# Patient Record
Sex: Male | Born: 1980 | Hispanic: Refuse to answer | Marital: Single | Smoking: Never smoker
Health system: Southern US, Community
[De-identification: ages and names within clinical notes are randomized; demographics above are authoritative.]

## PROBLEM LIST (undated history)

## (undated) DIAGNOSIS — J45909 Unspecified asthma, uncomplicated: Secondary | ICD-10-CM

## (undated) HISTORY — PX: TONSILLECTOMY: SUR1361

---

## 2019-04-03 ENCOUNTER — Emergency Department (HOSPITAL_COMMUNITY): Payer: Self-pay

## 2019-04-03 ENCOUNTER — Encounter (HOSPITAL_COMMUNITY): Payer: Self-pay | Admitting: Emergency Medicine

## 2019-04-03 ENCOUNTER — Emergency Department (HOSPITAL_COMMUNITY)
Admission: EM | Admit: 2019-04-03 | Discharge: 2019-04-03 | Disposition: A | Discharge status: Home / Self Care | Payer: Self-pay | Attending: Emergency Medicine | Admitting: Emergency Medicine

## 2019-04-03 ENCOUNTER — Other Ambulatory Visit: Payer: Self-pay

## 2019-04-03 DIAGNOSIS — Z20828 Contact with and (suspected) exposure to other viral communicable diseases: Secondary | ICD-10-CM | POA: Insufficient documentation

## 2019-04-03 DIAGNOSIS — J189 Pneumonia, unspecified organism: Secondary | ICD-10-CM | POA: Insufficient documentation

## 2019-04-03 DIAGNOSIS — K0381 Cracked tooth: Secondary | ICD-10-CM | POA: Insufficient documentation

## 2019-04-03 DIAGNOSIS — J45909 Unspecified asthma, uncomplicated: Secondary | ICD-10-CM | POA: Insufficient documentation

## 2019-04-03 DIAGNOSIS — K0889 Other specified disorders of teeth and supporting structures: Secondary | ICD-10-CM

## 2019-04-03 LAB — CBC WITH DIFFERENTIAL/PLATELET
Abs Immature Granulocytes: 0.02 10*3/uL (ref 0.00–0.07)
Basophils Absolute: 0 10*3/uL (ref 0.0–0.1)
Basophils Relative: 0 %
Eosinophils Absolute: 0.5 10*3/uL (ref 0.0–0.5)
Eosinophils Relative: 6 %
HCT: 43.6 % (ref 39.0–52.0)
Hemoglobin: 14.7 g/dL (ref 13.0–17.0)
Immature Granulocytes: 0 %
Lymphocytes Relative: 18 %
Lymphs Abs: 1.6 10*3/uL (ref 0.7–4.0)
MCH: 28.5 pg (ref 26.0–34.0)
MCHC: 33.7 g/dL (ref 30.0–36.0)
MCV: 84.5 fL (ref 80.0–100.0)
Monocytes Absolute: 0.6 10*3/uL (ref 0.1–1.0)
Monocytes Relative: 7 %
Neutro Abs: 6.2 10*3/uL (ref 1.7–7.7)
Neutrophils Relative %: 69 %
Platelets: 257 10*3/uL (ref 150–400)
RBC: 5.16 MIL/uL (ref 4.22–5.81)
RDW: 12.5 % (ref 11.5–15.5)
WBC: 9 10*3/uL (ref 4.0–10.5)
nRBC: 0 % (ref 0.0–0.2)

## 2019-04-03 LAB — BASIC METABOLIC PANEL
Anion gap: 10 (ref 5–15)
BUN: 6 mg/dL (ref 6–20)
CO2: 28 mmol/L (ref 22–32)
Calcium: 9.8 mg/dL (ref 8.9–10.3)
Chloride: 98 mmol/L (ref 98–111)
Creatinine, Ser: 1.33 mg/dL — ABNORMAL HIGH (ref 0.61–1.24)
GFR calc Af Amer: >60 mL/min (ref >60)
GFR calc non Af Amer: >60 mL/min (ref >60)
Glucose, Bld: 89 mg/dL (ref 70–99)
Potassium: 3.8 mmol/L (ref 3.5–5.1)
Sodium: 136 mmol/L (ref 135–145)

## 2019-04-03 LAB — SARS CORONAVIRUS 2 BY RT PCR (HOSPITAL ORDER, PERFORMED IN ~~LOC~~ HOSPITAL LAB): SARS Coronavirus 2: NEGATIVE

## 2019-04-03 MED ORDER — IOHEXOL 300 MG/ML  SOLN
75.0000 mL | Freq: Once | INTRAMUSCULAR | Status: AC | PRN
  Administered 2019-04-03: 17:00:00 75 mL via INTRAVENOUS

## 2019-04-03 MED ORDER — DOXYCYCLINE HYCLATE 100 MG PO CAPS: 100.0000 mg | ORAL_CAPSULE | Freq: Two times a day (BID) | ORAL | 0 refills | Status: AC

## 2019-04-03 MED ORDER — ALBUTEROL SULFATE HFA 108 (90 BASE) MCG/ACT IN AERS: 1.0000 | INHALATION_SPRAY | Freq: Four times a day (QID) | RESPIRATORY_TRACT | 0 refills | Status: DC | PRN

## 2019-04-03 MED ORDER — ALBUTEROL SULFATE HFA 108 (90 BASE) MCG/ACT IN AERS
4.0000 | INHALATION_SPRAY | Freq: Once | RESPIRATORY_TRACT | Status: AC
  Administered 2019-04-03: 15:00:00 4 via RESPIRATORY_TRACT
  Filled 2019-04-03: qty 6.7

## 2019-04-03 MED ORDER — PREDNISONE 20 MG PO TABS
60.0000 mg | ORAL_TABLET | Freq: Once | ORAL | Status: AC
  Administered 2019-04-03: 60 mg via ORAL
  Filled 2019-04-03 (×2): qty 3

## 2019-04-03 MED ORDER — AMOXICILLIN 500 MG PO CAPS: 500.0000 mg | ORAL_CAPSULE | Freq: Two times a day (BID) | ORAL | 0 refills | Status: DC

## 2019-04-03 NOTE — ED Notes (Signed)
Called CT.  They stated they will call us when the next pt is done in room 3.

## 2019-04-03 NOTE — ED Triage Notes (Signed)
Pt reports a 2 mont cracked tooth that is now infected. Pt wants some antibiotics.

## 2019-04-03 NOTE — ED Notes (Signed)
Patient stated that he would not take the prednisone before consulting with his "family heritage." Pt declined the medication at this time.

## 2019-04-03 NOTE — ED Provider Notes (Signed)
[facility] EMERGENCY DEPARTMENT Provider Note   CSN: [id number] Arrival date & time: 04/03/19  1255     History   Chief Complaint Chief Complaint  Patient presents with  . Dental Pain    HPI Haley Kuchera is a 38 y.o. male Modena Jansky for evaluation of dental pain.  He reports that over the last 2 months, he has had a cracked right upper tooth.  He states that it occasionally causes him pain.  He feels like over the last couple days, and is gotten worse.  He states he has had pain and irritation to his gum.  He has not noticed any facial swelling, fevers.  He states he still able to chew and is not any vomiting or difficulty breathing.  He states he does not have a dentist he follows up with.  He also states that he had a history of childhood asthma.  He states that over the last couple weeks, he has been wheezing and has had a slight cough.  He states he does not have any inhalers that he uses.     The history is provided by the patient.    History reviewed. No pertinent past medical history.  There are no active problems to display for this patient.   History reviewed. No pertinent surgical history.      Home Medications    Prior to Admission medications   Medication Sig Start Date End Date Taking? Authorizing Provider  albuterol (VENTOLIN HFA) 108 (90 Base) MCG/ACT inhaler Inhale 1-2 puffs into the lungs every 6 (six) hours as needed for wheezing or shortness of breath. 04/03/19   Graciella Freer A, PA-C  amoxicillin (AMOXIL) 500 MG capsule Take 1 capsule (500 mg total) by mouth 2 (two) times daily. 04/03/19   Maxwell Caul, PA-C  doxycycline (VIBRAMYCIN) 100 MG capsule Take 1 capsule (100 mg total) by mouth 2 (two) times daily for 7 days. 04/03/19 04/10/19  Maxwell Caul, PA-C    Family History No family history on file.  Social History Social History   Tobacco Use  . Smoking status: Never Smoker  . Smokeless tobacco: Never Used  Substance Use Topics   . Alcohol use: Not Currently  . Drug use: Never     Allergies   Patient has no known allergies.   Review of Systems Review of Systems  Constitutional: Negative for fever.  HENT: Positive for dental problem. Negative for facial swelling and trouble swallowing.   Respiratory: Positive for cough and wheezing. Negative for shortness of breath.   All other systems reviewed and are negative.    Physical Exam Updated Vital Signs BP 117/78 (BP Location: Left Arm)   Pulse 74   Temp 98.5 F (36.9 C) (Oral)   Resp 18   Ht 5\' 11"  (1.803 m)   Wt 72.6 kg   SpO2 99%   BMI 22.32 kg/m   Physical Exam Vitals signs and nursing note reviewed.  Constitutional:      Appearance: He is well-developed.  HENT:     Head: Normocephalic and atraumatic.     Comments: Face is symmetric in appearance without any overlying warmth, erythema, edema.    Mouth/Throat:      Comments: Airways patent, phonation is intact.  Uvula is midline.  No swelling noted to floor of mouth. Eyes:     General: No scleral icterus.       Right eye: No discharge.        Left eye: No  discharge.     Conjunctiva/sclera: Conjunctivae normal.  Cardiovascular:     Rate and Rhythm: Normal rate and regular rhythm.  Pulmonary:     Effort: Pulmonary effort is normal.     Breath sounds: Wheezing present.     Comments: Mild wheezing noted throughout all lung fields.  Able to speak in full sentences without any difficulty.  No evidence of respiratory distress. Skin:    General: Skin is warm and dry.  Neurological:     Mental Status: He is alert.  Psychiatric:        Speech: Speech normal.        Behavior: Behavior normal.      ED Treatments / Results  Labs (all labs ordered are listed, but only abnormal results are displayed) Labs Reviewed  BASIC METABOLIC PANEL - Abnormal; Notable for the following components:      Result Value   Creatinine, Ser 1.33 (*)    All other components within normal limits  SARS  CORONAVIRUS 2 (HOSPITAL ORDER, PERFORMED IN [facility])  CBC WITH DIFFERENTIAL/PLATELET    EKG None  Radiology Dg Chest 2 View  Result Date: 04/03/2019 CLINICAL DATA:  Productive cough EXAM: CHEST - 2 VIEW COMPARISON:  None. FINDINGS: The heart size and mediastinal contours are within normal limits. There is bilateral heterogeneous and nodular airspace opacity, most conspicuous in the left lower lobe although also seen in the right upper lobe. The visualized skeletal structures are unremarkable. IMPRESSION: There is bilateral heterogeneous and nodular airspace opacity, most conspicuous in the left lower lobe although also seen in the right upper lobe. Findings are concerning for multifocal infection, including atypical infection. Consider CT to further evaluate. Electronically Signed   By: Lauralyn PrimesAlex  Bibbey M.D.   On: 04/03/2019 14:03   Ct Chest W Contrast  Result Date: 04/03/2019 CLINICAL DATA:  Infected tooth. Productive cough. Airspace opacities on chest radiography. EXAM: CT CHEST WITH CONTRAST TECHNIQUE: Multidetector CT imaging of the chest was performed following the standard protocol during bolus administration of intravenous contrast. CONTRAST:  75mL OMNIPAQUE IOHEXOL 300 MG/ML  SOLN COMPARISON:  Chest radiograph 04/03/2019 FINDINGS: CT CHEST FINDINGS Cardiovascular: Unremarkable Mediastinum/Nodes: Right hilar lymph node 1.3 cm in short axis, image 70/3. Left hilar lymph node 1.2 cm in short axis, image 83/3. Scattered small hilar lymph nodes. Subcarinal lymph node 1.1 cm in short axis, image 72/3. Lungs/Pleura: Bilateral airway thickening is noted with scattered airway plugging. In several areas of airway plugging including in both lower lobes there is considerable airspace opacity in segments and sub segments subtended by the plugged airways. There also some scattered reticulonodular opacities in both lungs. Upper abdomen: Unremarkable Musculoskeletal: Prominent paucity of body fat.  IMPRESSION: 1. Bilateral airway thickening and considerable airway plugging. Multiple areas of airway plugging or associated with downstream confluent airspace opacities especially in the lung bases. There also scattered areas of reticulonodular opacity and tree-in-bud opacity favoring atypical infectious process. 2. Bilateral hilar adenopathy. Electronically Signed   By: Gaylyn RongWalter  Liebkemann M.D.   On: 04/03/2019 17:35    Procedures Procedures (including critical care time)  Medications Ordered in ED Medications  albuterol (VENTOLIN HFA) 108 (90 Base) MCG/ACT inhaler 4 puff (4 puffs Inhalation Given 04/03/19 1500)  predniSONE (DELTASONE) tablet 60 mg (60 mg Oral Given 04/03/19 1541)  iohexol (OMNIPAQUE) 300 MG/ML solution 75 mL (75 mLs Intravenous Contrast Given 04/03/19 1704)     Initial Impression / Assessment and Plan / ED Course  I have reviewed the  triage vital signs and the nursing notes.  Pertinent labs & imaging results that were available during my care of the patient were reviewed by me and considered in my medical decision making (see chart for details).        38 year old male who presents for evaluation of dental pain that is been ongoing for last several months.  States that it is worsened in the last several days.  He has not noted any fevers, facial swelling, difficulty eating or drinking.  Also reports history of asthma states he feels like he has been wheezing and coughing.  He states he is having cough that is productive of mucus.  No fevers. Patient is afebrile, non-toxic appearing, sitting comfortably on examination table. Vital signs reviewed and stable.  On exam, he has partially cracked tooth of tooth #14.  There is no surrounding gingival edema or evidence of fluctuance that would be concerning for abscess that would be concerning for I&D.  History/physical exam not concerning for Ludwig angina or peritonsillar abscess.  Additionally, lung exam, he does have diffuse  wheezing noted throughout.  No evidence of respiratory distress.  He does report a history of asthma as a teenager but does not have any inhalers.  Plan for steroids, inhalers and chest x-ray.  Chest x-ray shows bilateral heterogenous nodular airspace opacity that could represent multifocal versus atypical infection.  Consider CT for further evaluation.  Discussed results with patient.  He reports feeling better after albuterol here in the ED.  We will plan for lab work, [place].  BMP with creatinine of 1.33.  Otherwise unremarkable.  CBC without any significant cytosis.  A COVID is negative.  CT chest shows bilateral airway thickening and considerable airway plugging.  There is also mention of scattered areas of reticulonodular opacity and tree-in-bud opacity favoring atypical infectious process.  He also has some bilateral hilar adenopathy.  Discussed results with both patient and visitor at bedside.  We will plan to treat him for possible atypical infection.  Patient with no known drug allergies.  Instructed him that he will need to follow-up with pulmonology regarding findings on CT scan.  Additionally, will plan to treat his dental pain as possible dental abscess.  Patient given referral to dental clinics and told to follow-up.  Patient is walking without any difficulty and reports improvement in wheezing.  Vitals are stable. At this time, patient exhibits no emergent life-threatening condition that require further evaluation in ED or admission. Patient had ample opportunity for questions and discussion. All patient's questions were answered with full understanding. Strict return precautions discussed. Patient expresses understanding and agreement to plan.   Portions of this note were generated with Lobby[employer]. Dictation errors may occur despite best attempts at proofreading.   Final Clinical Impressions(s) / ED Diagnoses   Final diagnoses:  Pain, dental  Atypical pneumonia    ED  Discharge Orders         Ordered    doxycycline (VIBRAMYCIN) 100 MG capsule  2 times daily     04/03/19 1835    amoxicillin (AMOXIL) 500 MG capsule  2 times daily     04/03/19 1835    albuterol (VENTOLIN HFA) 108 (90 Base) MCG/ACT inhaler  Every 6 hours PRN     04/03/19 1835           Volanda Napoleon, PA-C 04/03/19 1918    Tegeler, Gwenyth Allegra, MD 04/03/19 [phone]

## 2019-04-03 NOTE — Discharge Instructions (Signed)
You can take Tylenol or Ibuprofen as directed for pain. You can alternate Tylenol and Ibuprofen every 4 hours. If you take Tylenol at 1pm, then you can take Ibuprofen at 5pm. Then you can take Tylenol again at 9pm.   Take antibiotics as directed. Please take all of your antibiotics until finished.  As we discussed today, your chest CT showed some atypical infectious qualities.  Additionally, he had some lymph nodes that were surrounding this.  This may be related to pneumonia.  Take antibiotics as directed.  You also need to follow-up with pulmonology.  Call their office and arrange for an appointment.  Use inhalers as directed.  I have also provided you dental clinic information.  Please follow-up with referred dentist or dental clinics in the area.  Please follow-up with one of the dental clinics provided to you below or in your paperwork. Call and tell them you were seen in the Emergency Dept and arrange for an appointment. You may have to call multiple places in order to find a place to be seen.  Dental Assistance If the dentist on-call cannot see you, please use the resources below:   Patients with Medicaid: [facility], [place] [address], [phone]  If unable to pay, or uninsured, contact [facility] [phone]) or [place] [phone] in [place], [place] in [facility]) to become qualified for the adult dental clinic  Other [place]- [place], [place], [place], [address]    [phone], Ext. [phone]    2nd and 4th Thursday of the month at 6:30am    10 clients each day by appointment, can sometimes see walk-in     patients if someone does not show for an appointment [place], [place], [place], [address]    [phone] [facility]- [address], [place], [place], [address]    [phone]  [place]  Department- [phone] [place] [facility] Department- [phone]

## 2019-04-03 NOTE — ED Notes (Signed)
Patient requested to take the prednisone after looking it up and becoming familiar with it. The patient was tested for COVID per order and an IV was inserted.

## 2019-04-08 ENCOUNTER — Encounter: Payer: Self-pay | Admitting: Dentistry

## 2019-04-19 ENCOUNTER — Other Ambulatory Visit: Payer: Self-pay

## 2019-04-19 ENCOUNTER — Ambulatory Visit (INDEPENDENT_AMBULATORY_CARE_PROVIDER_SITE_OTHER): Payer: Self-pay | Admitting: Pulmonary Disease

## 2019-04-19 ENCOUNTER — Encounter: Payer: Self-pay | Admitting: Pulmonary Disease

## 2019-04-19 VITALS — BP 118/66 | HR 64 | Ht 71.0 in | Wt 144.6 lb

## 2019-04-19 DIAGNOSIS — J189 Pneumonia, unspecified organism: Secondary | ICD-10-CM

## 2019-04-19 NOTE — Patient Instructions (Signed)
Abnormal CT scan of the chest consistent with an infectious process You just finished a course of antibiotics  Repeat CT scan in 6 weeks I will see you in the office after the CT scan  Call with any significant concerns

## 2019-04-19 NOTE — Progress Notes (Signed)
Subjective:     Patient ID: Nicholas Ochoa, male   DOB: November 01, 1980, 38 y.o.   MRN: [id number]  Was recently evaluated in the emergency room with shortness of breath, wheezing, cough  Has a background history of asthma-was not on any inhalers Seasonal asthma  He recently also had a dental infection  He was recently on antibiotics, just finishing up a course of antibiotics  He was coughing bringing up yellow-green phlegm-this is better He has no fevers or chills  He did lose some weight around the time that he was sick, was not eating well  Denies any history of immunocompromise Stated he had an HIV testing performed about 5-6 years ago and was negative-has not been active  Never smoker No history of illicit drug use  No pets  He works Architect  No past medical history on file. Social History   Socioeconomic History  . Marital status: Single    Spouse name: Not on file  . Number of children: Not on file  . Years of education: Not on file  . Highest education level: Not on file  Occupational History  . Not on file  Social Needs  . Financial resource strain: Not on file  . Food insecurity    Worry: Not on file    Inability: Not on file  . Transportation needs    Medical: Not on file    Non-medical: Not on file  Tobacco Use  . Smoking status: Never Smoker  . Smokeless tobacco: Never Used  Substance and Sexual Activity  . Alcohol use: Not Currently  . Drug use: Never  . Sexual activity: Yes  Lifestyle  . Physical activity    Days per week: Not on file    Minutes per session: Not on file  . Stress: Not on file  Relationships  . Social Herbalist on phone: Not on file    Gets together: Not on file    Attends religious service: Not on file    Active member of club or organization: Not on file    Attends meetings of clubs or organizations: Not on file    Relationship status: Not on file  . Intimate partner violence    Fear of current or ex partner: Not  on file    Emotionally abused: Not on file    Physically abused: Not on file    Forced sexual activity: Not on file  Other Topics Concern  . Not on file  Social History Narrative  . Not on file      Review of Systems  Constitutional: Negative for fever and unexpected weight change.  HENT: Positive for dental problem. Negative for congestion, ear pain, nosebleeds, postnasal drip, rhinorrhea, sinus pressure, sneezing, sore throat and trouble swallowing.   Eyes: Negative for redness and itching.  Respiratory: Positive for wheezing. Negative for cough, chest tightness and shortness of breath.   Cardiovascular: Negative for palpitations and leg swelling.  Gastrointestinal: Negative for nausea and vomiting.  Genitourinary: Negative for dysuria.  Musculoskeletal: Negative for joint swelling.  Skin: Negative for rash.  Allergic/Immunologic: Negative.  Negative for environmental allergies, food allergies and immunocompromised state.  Neurological: Negative for headaches.  Hematological: Does not bruise/bleed easily.  Psychiatric/Behavioral: Negative for dysphoric mood. The patient is not nervous/anxious.        Objective:   Physical Exam Constitutional:      Appearance: Normal appearance.  HENT:     Head: Normocephalic and atraumatic.  Nose: Nose normal. No congestion or rhinorrhea.     Mouth/Throat:     Mouth: Mucous membranes are moist.  Eyes:     General:        Right eye: No discharge.        Left eye: No discharge.     Extraocular Movements: Extraocular movements intact.     Pupils: Pupils are equal, round, and reactive to light.  Neck:     Musculoskeletal: Normal range of motion and neck supple. No neck rigidity or muscular tenderness.  Cardiovascular:     Rate and Rhythm: Normal rate and regular rhythm.     Heart sounds: No murmur. No friction rub.  Pulmonary:     Effort: Pulmonary effort is normal. No respiratory distress.     Breath sounds: Normal breath sounds.  No stridor. No wheezing or rhonchi.  Abdominal:     General: Abdomen is flat. There is no distension.     Tenderness: There is no abdominal tenderness.  Musculoskeletal: Normal range of motion.        General: No swelling.  Lymphadenopathy:     Cervical: No cervical adenopathy.  Skin:    General: Skin is warm and dry.     Coloration: Skin is not jaundiced.  Neurological:     Deep Tendon Reflexes: Abnormal reflex:    Psychiatric:        Mood and Affect: Mood normal.    CT scan of the chest reviewed with the patient-multifocal infiltrates, secretions    Assessment:     Abnormal CT scan of the chest  Mucous plugging  Recent pneumonia  Has just finished a course of antibiotics Clinically feeling better    History of asthma-continue albuterol as needed  Plan:     CT follow-up of findings CT will be repeated in 6 weeks  I will see him back in the office in about 6 weeks after the CT  Encouraged to call with any significant concerns  Expectation is for the CT to have cleared up by then Bronchoscopy discussed as an option of further evaluation if there is persistent findings

## 2019-05-03 ENCOUNTER — Ambulatory Visit: Payer: Self-pay | Admitting: Nurse Practitioner

## 2019-05-07 ENCOUNTER — Telehealth: Payer: Self-pay | Admitting: General Practice

## 2019-05-07 NOTE — Telephone Encounter (Signed)
Called and left vm for patient to call back and r/s appt for 05/20/2019 to a day provider will be in office.

## 2019-05-20 ENCOUNTER — Ambulatory Visit: Payer: Self-pay | Admitting: Nurse Practitioner

## 2019-05-31 ENCOUNTER — Other Ambulatory Visit: Payer: Self-pay

## 2019-05-31 ENCOUNTER — Ambulatory Visit (INDEPENDENT_AMBULATORY_CARE_PROVIDER_SITE_OTHER)
Admission: RE | Admit: 2019-05-31 | Discharge: 2019-05-31 | Disposition: A | Discharge status: Home / Self Care | Payer: Self-pay | Source: Ambulatory Visit | Attending: Pulmonary Disease | Admitting: Pulmonary Disease

## 2019-05-31 DIAGNOSIS — J189 Pneumonia, unspecified organism: Secondary | ICD-10-CM

## 2019-06-02 ENCOUNTER — Other Ambulatory Visit: Payer: Self-pay

## 2019-06-02 ENCOUNTER — Encounter: Payer: Self-pay | Admitting: Pulmonary Disease

## 2019-06-02 ENCOUNTER — Ambulatory Visit (INDEPENDENT_AMBULATORY_CARE_PROVIDER_SITE_OTHER): Payer: Self-pay | Admitting: Pulmonary Disease

## 2019-06-02 VITALS — BP 120/70 | HR 84 | Temp 97.7°F | Ht 71.0 in | Wt 146.4 lb

## 2019-06-02 DIAGNOSIS — J189 Pneumonia, unspecified organism: Secondary | ICD-10-CM

## 2019-06-02 NOTE — Progress Notes (Signed)
Subjective:     Patient ID: Nicholas Ochoa, male   DOB: Apr 01, 1981, 38 y.o.   MRN: [id number]  Was recently evaluated in the emergency room with shortness of breath, wheezing, cough  Symptoms have improved significantly since her last visit to the office Has been using albuterol with good effect  Background history of asthma  Did finish up a course of antibiotics following initial CT scan  He was coughing bringing up yellow-green phlegm-this is better He has no fevers or chills  He did lose some weight around the time that he was sick, was not eating well  Denies any history of immunocompromise Stated he had an HIV testing performed about 5-6 years ago and was negative-has not been active  Never smoker No history of illicit drug use  No pets  He works Holiday representative  He still does have a cough, minimal secretions, feels the albuterol helps clear secretions History reviewed. No pertinent past medical history. Social History   Socioeconomic History  . Marital status: Single    Spouse name: Not on file  . Number of children: Not on file  . Years of education: Not on file  . Highest education level: Not on file  Occupational History  . Not on file  Social Needs  . Financial resource strain: Not on file  . Food insecurity    Worry: Not on file    Inability: Not on file  . Transportation needs    Medical: Not on file    Non-medical: Not on file  Tobacco Use  . Smoking status: Never Smoker  . Smokeless tobacco: Never Used  Substance and Sexual Activity  . Alcohol use: Not Currently  . Drug use: Never  . Sexual activity: Yes  Lifestyle  . Physical activity    Days per week: Not on file    Minutes per session: Not on file  . Stress: Not on file  Relationships  . Social Musician on phone: Not on file    Gets together: Not on file    Attends religious service: Not on file    Active member of club or organization: Not on file    Attends meetings of clubs or  organizations: Not on file    Relationship status: Not on file  . Intimate partner violence    Fear of current or ex partner: Not on file    Emotionally abused: Not on file    Physically abused: Not on file    Forced sexual activity: Not on file  Other Topics Concern  . Not on file  Social History Narrative  . Not on file      Review of Systems  Constitutional: Negative for fever and unexpected weight change.  HENT: Positive for dental problem. Negative for congestion, ear pain, nosebleeds, postnasal drip, rhinorrhea, sinus pressure, sneezing, sore throat and trouble swallowing.   Eyes: Negative for redness and itching.  Respiratory: Positive for cough. Negative for chest tightness, shortness of breath and wheezing.   Cardiovascular: Negative for palpitations and leg swelling.  Gastrointestinal: Negative for nausea and vomiting.  Genitourinary: Negative for dysuria.  Musculoskeletal: Negative for joint swelling.  Skin: Negative for rash.  Allergic/Immunologic: Negative.  Negative for environmental allergies, food allergies and immunocompromised state.  Neurological: Negative for headaches.       Objective:   Physical Exam Constitutional:      Appearance: Normal appearance.  HENT:     Head: Normocephalic and atraumatic.     Nose:  Nose normal. No congestion or rhinorrhea.     Mouth/Throat:     Mouth: Mucous membranes are moist.  Eyes:     General:        Right eye: No discharge.        Left eye: No discharge.     Extraocular Movements: Extraocular movements intact.     Pupils: Pupils are equal, round, and reactive to light.  Neck:     Musculoskeletal: Normal range of motion and neck supple. No neck rigidity or muscular tenderness.  Cardiovascular:     Rate and Rhythm: Normal rate and regular rhythm.     Heart sounds: No murmur. No friction rub.  Pulmonary:     Effort: Pulmonary effort is normal. No respiratory distress.     Breath sounds: Normal breath sounds. No  stridor. No wheezing or rhonchi.  Abdominal:     General: Abdomen is flat. There is no distension.     Tenderness: There is no abdominal tenderness.  Musculoskeletal: Normal range of motion.        General: No swelling.  Lymphadenopathy:     Cervical: No cervical adenopathy.  Skin:    General: Skin is warm and dry.     Coloration: Skin is not jaundiced.  Neurological:     Deep Tendon Reflexes: Abnormal reflex:    Psychiatric:        Mood and Affect: Mood normal.    Vitals:   06/02/19 1434  BP: 120/70  Pulse: 84  Temp: 97.7 F (36.5 C)  SpO2: 95%    CT scan of the chest reviewed with the patient-multifocal infiltrates, secretions-much improved compared to the CT scan from August, the nodular infiltrative changes did improve.  Still with significant mucous plugging    Assessment:     Abnormal CT scan of the chest -Mucous plugging in the lower airways -Nodular infiltrative process did improve -Still has evidence of tree-in-bud changes  Mucous plugging -Possibility of atypical mycobacterial infection  Recent pneumonia -Significant improvement in symptoms  Clinically feeling better    History of asthma-continue albuterol as needed  Bronchoscopy discussed as a means of evaluating the airway and getting sampling from the lung to rule out atypical mycobacterial infection He feels since he is feeling better at the present time he will want to look up more information prior to making a decision with regards to further evaluation  Plan:     CT follow-up of findings will be performed in 6 months  Encouraged to call if he changes his mind about this bronchoscopy  We will see him back in the office following repeat CT scan of the chest Continue with inhalers for asthma  Encouraged to call with any significant concerns

## 2019-06-02 NOTE — Patient Instructions (Signed)
CT scan of the chest showing improvement in infiltrative process, still has evidence of mucous plugging your breathing tubes  Bronchoscopy-evaluation of your airway with a scope as discussed-usually we will put in sterile water and watch back out for sampling and testing  We will repeat your CT scan in about 6 months I will see you after the repeat CT scan Call with any significant concerns Flexible Bronchoscopy  Flexible bronchoscopy is a procedure that is used to examine the passageways in the lungs. During the procedure, a thin, flexible tool with a camera on it (bronchoscope) is passed into the mouth or nose, down through the windpipe (trachea), and into the air tubes (bronchi) in the lungs. This tool allows your health care provider to look at your lungs from the inside and take testing (diagnostic) samples if needed. Tell a health care provider about:  Any allergies you have.  All medicines you are taking, including vitamins, herbs, eye drops, creams, and over-the-counter medicines.  Any problems you or family members have had with anesthetic medicines.  Any blood disorders you have.  Any surgeries you have had.  Any medical conditions you have.  Whether you are pregnant or may be pregnant. What are the risks? Generally, this is a safe procedure. However, problems may occur, including:  Infection.  Bleeding.  Damage to other structures or organs.  Allergic reactions to medicines.  Collapsed lung (pneumothorax).  Increased need for oxygen or difficulty breathing after the procedure. What happens before the procedure? Medicines Ask your health care provider about:  Changing or stopping your regular medicines. This is especially important if you are taking diabetes medicines or blood thinners.  Taking medicines such as aspirin and ibuprofen. These medicines can thin your blood. Do not take these medicines before your procedure if your health care provider instructs you  not to. You may be given antibiotic medicine to help prevent infection. Staying hydrated Follow instructions from your health care provider about hydration, which may include:  Up to 2 hours before the procedure - you may continue to drink clear liquids, such as water, clear fruit juice, black coffee, and plain tea. Eating and drinking Follow instructions from your health care provider about eating and drinking, which may include:  8 hours before the procedure - stop eating heavy meals or foods such as meat, fried foods, or fatty foods.  6 hours before the procedure - stop eating light meals or foods, such as toast or cereal.  6 hours before the procedure - stop drinking milk or drinks that contain milk.  2 hours before the procedure - stop drinking clear liquids. General instructions  Plan to have someone take you home from the hospital or clinic.  If you will be going home right after the procedure, plan to have someone with you for 24 hours. What happens during the procedure?  To lower your risk of infection: ? Your health care team will wash or sanitize their hands. ? Your skin will be washed with soap.  An IV tube will be inserted into one of your veins.  You will be given a medicine (local anesthetic) to numb your mouth, nose, throat, and voice box (larynx). You may also be given one or more of the following: ? A medicine to help you relax (sedative). ? A medicine to control coughing. ? A medicine to dry up any fluids in your lungs (secretions).  A bronchoscope will be passed into your nose or mouth, and into your lungs. Your  health care provider will examine your lungs.  Samples of airway secretions may be collected for testing.  If abnormal areas are seen in your airways, tissue samples may be removed for examination under a microscope (biopsy).  If tissue samples are needed from the outer parts of the lung, a type of X-ray (fluoroscopy) may be used to guide the  bronchoscope to these areas.  If bleeding occurs, you may be given medicine to stop or decrease the bleeding. The procedure may vary among health care providers and hospitals. What happens after the procedure?  Do not drive for 24 hours if you were given a sedative.  Your blood pressure, heart rate, breathing rate, and blood oxygen level will be monitored until the medicines you were given have worn off.  You may have a chest X-ray to check for signs of pneumothorax.  You will not be allowed to eat or drink anything for 2 hours after your procedure.  If a biopsy was taken, it is up to you to get the results of your procedure. Ask your health care provider, or the department that is doing the procedure, when your results will be ready. Summary  Flexible bronchoscopy is a procedure that allows your health care provider to look closely at your lungs from the inside and take testing (diagnostic) samples if needed.  Risks of flexible bronchoscopy include bleeding, infection, and pneumothorax.  Before a flexible bronchoscopy, you will be given a medicine (local anesthetic) to numb your mouth, nose, throat, and voice box (larynx). Then, a bronchoscope will be passed into your nose or mouth, and into your lungs.  After the procedure, your blood pressure, heart rate, breathing rate, and blood oxygen level will be monitored until the medicines you were given have worn off. You may have a chest X-ray to check for signs of pneumothorax.  You will not be allowed to eat or drink anything for 2 hours after your procedure. This information is not intended to replace advice given to you by your health care provider. Make sure you discuss any questions you have with your health care provider. Document Released: 07/19/2000 Document Revised: 07/04/2017 Document Reviewed: 08/24/2016 Elsevier Patient Education  2020 [employer].

## 2019-06-14 ENCOUNTER — Other Ambulatory Visit: Payer: Self-pay | Admitting: Nurse Practitioner

## 2019-06-14 NOTE — Telephone Encounter (Signed)
Medication Refill - Medication: albuterol (VENTOLIN HFA) 108 (90 Base) MCG/ACT inhaler  Has the patient contacted their pharmacy? Yes.   (Agent: If no, request that the patient contact the pharmacy for the refill.) (Agent: If yes, when and what did the pharmacy advise?)  Preferred Pharmacy (with phone number or street name): [id number] - [place], [place]: Please be advised that RX refills may take up to 3 business days. We ask that you follow-up with your pharmacy.

## 2019-06-14 NOTE — Telephone Encounter (Signed)
Requested medication (s) are due for refill today: yes  Requested medication (s) are on the active medication list: yes  Last refill:  04/03/2019  Future visit scheduled: yes  Notes to clinic:  Review for refill    Requested Prescriptions  Pending Prescriptions Disp Refills   albuterol (VENTOLIN HFA) 108 (90 Base) MCG/ACT inhaler 8 g 0    Sig: Inhale 1-2 puffs into the lungs every 6 (six) hours as needed for wheezing or shortness of breath.     Pulmonology:  Beta Agonists Failed - 06/14/2019  7:32 AM      Failed - One inhaler should last at least one month. If the patient is requesting refills earlier, contact the patient to check for uncontrolled symptoms.      Failed - Valid encounter within last 12 months    Recent Outpatient Visits    None      Future Appointments            In 4 weeks Nche, Charlene Brooke, NP [facility] [place], [place]

## 2019-06-15 ENCOUNTER — Telehealth: Payer: Self-pay | Admitting: Nurse Practitioner

## 2019-06-15 ENCOUNTER — Ambulatory Visit: Payer: Self-pay | Admitting: Nurse Practitioner

## 2019-06-15 NOTE — Telephone Encounter (Signed)
Patient called in to have medication refilled however medication was denied please reach out to patient

## 2019-06-15 NOTE — Telephone Encounter (Signed)
Pt call stated that he has new pt appt with Baldo Ash on 07/12/2019. Pt was wondering if Baldo Ash can send in inhaler to help him out until he comes in (pt stated he is having money problem right now that's why he is unable to see [place] sooner) pt stated he still try to get better from inflammation in his lung and inhaler help him breath better.   Please advise,

## 2019-06-16 ENCOUNTER — Telehealth: Payer: Self-pay | Admitting: Pulmonary Disease

## 2019-06-16 MED ORDER — ALBUTEROL SULFATE HFA 108 (90 BASE) MCG/ACT IN AERS: 1.0000 | INHALATION_SPRAY | Freq: Four times a day (QID) | RESPIRATORY_TRACT | 1 refills | Status: DC | PRN

## 2019-06-16 NOTE — Telephone Encounter (Signed)
He has not established care with me, but was seen by pulmonology 06/02/2019.  he should contact pulmonology for albuterol refill

## 2019-06-16 NOTE — Telephone Encounter (Signed)
Albuterol inhaler was refilled  Spoke with the pt and notified that this was done  Nothing further needed

## 2019-06-16 NOTE — Telephone Encounter (Signed)
Pt is aware.  

## 2019-07-12 ENCOUNTER — Ambulatory Visit: Payer: Self-pay | Admitting: Nurse Practitioner

## 2019-08-02 ENCOUNTER — Encounter: Payer: Self-pay | Admitting: Nurse Practitioner

## 2019-08-06 ENCOUNTER — Other Ambulatory Visit: Payer: Self-pay | Admitting: Pulmonary Disease

## 2019-08-08 ENCOUNTER — Telehealth: Payer: Self-pay | Admitting: Pulmonary Disease

## 2019-08-08 MED ORDER — ALBUTEROL SULFATE HFA 108 (90 BASE) MCG/ACT IN AERS: 2.0000 | INHALATION_SPRAY | Freq: Four times a day (QID) | RESPIRATORY_TRACT | 1 refills | Status: DC | PRN

## 2019-08-08 NOTE — Telephone Encounter (Signed)
Call the answering service  Run out of albuterol  Refills placed

## 2019-08-16 ENCOUNTER — Ambulatory Visit: Payer: Self-pay | Admitting: Nurse Practitioner

## 2019-09-09 ENCOUNTER — Other Ambulatory Visit: Payer: Self-pay | Admitting: Pulmonary Disease

## 2019-09-10 ENCOUNTER — Emergency Department (HOSPITAL_COMMUNITY)
Admission: EM | Admit: 2019-09-10 | Discharge: 2019-09-10 | Disposition: A | Discharge status: Home / Self Care | Payer: Self-pay | Attending: Emergency Medicine | Admitting: Emergency Medicine

## 2019-09-10 ENCOUNTER — Encounter (HOSPITAL_COMMUNITY): Payer: Self-pay | Admitting: Emergency Medicine

## 2019-09-10 ENCOUNTER — Emergency Department (HOSPITAL_COMMUNITY): Payer: Self-pay

## 2019-09-10 ENCOUNTER — Other Ambulatory Visit: Payer: Self-pay

## 2019-09-10 DIAGNOSIS — J45901 Unspecified asthma with (acute) exacerbation: Secondary | ICD-10-CM | POA: Insufficient documentation

## 2019-09-10 DIAGNOSIS — J189 Pneumonia, unspecified organism: Secondary | ICD-10-CM | POA: Insufficient documentation

## 2019-09-10 DIAGNOSIS — J9809 Other diseases of bronchus, not elsewhere classified: Secondary | ICD-10-CM

## 2019-09-10 DIAGNOSIS — J4541 Moderate persistent asthma with (acute) exacerbation: Secondary | ICD-10-CM

## 2019-09-10 DIAGNOSIS — Z20822 Contact with and (suspected) exposure to covid-19: Secondary | ICD-10-CM | POA: Insufficient documentation

## 2019-09-10 HISTORY — DX: Unspecified asthma, uncomplicated: J45.909

## 2019-09-10 LAB — COMPREHENSIVE METABOLIC PANEL
ALT: 20 U/L (ref 0–44)
AST: 25 U/L (ref 15–41)
Albumin: 3.6 g/dL (ref 3.5–5.0)
Alkaline Phosphatase: 92 U/L (ref 38–126)
Anion gap: 12 (ref 5–15)
BUN: 6 mg/dL (ref 6–20)
CO2: 25 mmol/L (ref 22–32)
Calcium: 9.3 mg/dL (ref 8.9–10.3)
Chloride: 101 mmol/L (ref 98–111)
Creatinine, Ser: 1.16 mg/dL (ref 0.61–1.24)
GFR calc Af Amer: >60 mL/min (ref >60)
GFR calc non Af Amer: >60 mL/min (ref >60)
Glucose, Bld: 87 mg/dL (ref 70–99)
Potassium: 3.8 mmol/L (ref 3.5–5.1)
Sodium: 138 mmol/L (ref 135–145)
Total Bilirubin: 0.8 mg/dL (ref 0.3–1.2)
Total Protein: 7.7 g/dL (ref 6.5–8.1)

## 2019-09-10 LAB — HIV ANTIBODY (ROUTINE TESTING W REFLEX): HIV Screen 4th Generation wRfx: NONREACTIVE

## 2019-09-10 LAB — CBC
HCT: 41.6 % (ref 39.0–52.0)
Hemoglobin: 13.9 g/dL (ref 13.0–17.0)
MCH: 28.1 pg (ref 26.0–34.0)
MCHC: 33.4 g/dL (ref 30.0–36.0)
MCV: 84 fL (ref 80.0–100.0)
Platelets: 234 10*3/uL (ref 150–400)
RBC: 4.95 MIL/uL (ref 4.22–5.81)
RDW: 13.3 % (ref 11.5–15.5)
WBC: 8.8 10*3/uL (ref 4.0–10.5)
nRBC: 0 % (ref 0.0–0.2)

## 2019-09-10 LAB — RESPIRATORY PANEL BY RT PCR (FLU A&B, COVID)
Influenza A by PCR: NEGATIVE
Influenza B by PCR: NEGATIVE
SARS Coronavirus 2 by RT PCR: NEGATIVE

## 2019-09-10 LAB — LACTIC ACID, PLASMA: Lactic Acid, Venous: 1.5 mmol/L (ref 0.5–1.9)

## 2019-09-10 LAB — POC SARS CORONAVIRUS 2 AG -  ED: SARS Coronavirus 2 Ag: NEGATIVE

## 2019-09-10 MED ORDER — PREDNISONE 20 MG PO TABS
60.0000 mg | ORAL_TABLET | Freq: Once | ORAL | Status: AC
  Administered 2019-09-10: 09:00:00 60 mg via ORAL
  Filled 2019-09-10: qty 3

## 2019-09-10 MED ORDER — SODIUM CHLORIDE 0.9 % IV SOLN
1.0000 g | Freq: Once | INTRAVENOUS | Status: AC
  Administered 2019-09-10: 1 g via INTRAVENOUS
  Filled 2019-09-10: qty 10

## 2019-09-10 MED ORDER — MORPHINE SULFATE (PF) 2 MG/ML IV SOLN: 1.0000 mg | Freq: Once | INTRAVENOUS | Status: DC | PRN

## 2019-09-10 MED ORDER — ALBUTEROL SULFATE HFA 108 (90 BASE) MCG/ACT IN AERS
2.0000 | INHALATION_SPRAY | RESPIRATORY_TRACT | Status: DC | PRN
  Administered 2019-09-10: 09:00:00 2 via RESPIRATORY_TRACT
  Filled 2019-09-10: qty 6.7

## 2019-09-10 MED ORDER — AEROCHAMBER PLUS FLO-VU LARGE MISC
1.0000 | Freq: Once | Status: AC
  Administered 2019-09-10: 1

## 2019-09-10 MED ORDER — DOXYCYCLINE HYCLATE 100 MG PO CAPS: 100.0000 mg | ORAL_CAPSULE | Freq: Two times a day (BID) | ORAL | 0 refills | Status: DC

## 2019-09-10 MED ORDER — PREDNISONE 10 MG PO TABS: 40.0000 mg | ORAL_TABLET | Freq: Every day | ORAL | 0 refills | Status: DC

## 2019-09-10 MED ORDER — SODIUM CHLORIDE 0.9 % IV BOLUS: 1000.0000 mL | Freq: Once | INTRAVENOUS | Status: DC

## 2019-09-10 MED ORDER — ALBUTEROL SULFATE HFA 108 (90 BASE) MCG/ACT IN AERS: 1.0000 | INHALATION_SPRAY | Freq: Four times a day (QID) | RESPIRATORY_TRACT | 0 refills | Status: DC | PRN

## 2019-09-10 MED ORDER — PREDNISONE 10 MG PO TABS: 40.0000 mg | ORAL_TABLET | Freq: Every day | ORAL | 0 refills | Status: AC

## 2019-09-10 MED ORDER — METHYLPREDNISOLONE SODIUM SUCC 125 MG IJ SOLR: 125.0000 mg | Freq: Once | INTRAMUSCULAR | Status: DC

## 2019-09-10 MED ORDER — SODIUM CHLORIDE 0.9 % IV SOLN
500.0000 mg | Freq: Once | INTRAVENOUS | Status: AC
  Administered 2019-09-10: 11:00:00 500 mg via INTRAVENOUS
  Filled 2019-09-10: qty 500

## 2019-09-10 NOTE — Consult Note (Signed)
NAMEDelfin Ochoa, MRN:  [id number], DOB:  1980/10/21, LOS: 0 ADMISSION DATE:  09/10/2019, CONSULTATION DATE:  09/10/19 REFERRING MD:  Jeanell Sparrow, CHIEF COMPLAINT:  Dyspnea   Brief History   39 year old male with asthma presented to ED with SOB  History of present illness   39 year old male who is followed in the pulmonary clinic by Dr. Ander Slade for asthma. He reports having asthma as a child, which was largely exacerbated by allergies and was treated with allergy injections. He was hospitalized and was never intubated. He grew out of this in his teens and had been asymptomatic until 03/2019 when he was diagnosed with community acquired pneumonia. He denies smoking, drinking, and drug use. Since the time of his pneumonia he has struggled with his breathing off and on. He currently uses only albuterol PRN for treatment. Dr. Ander Slade has recommended several options including bronchoscopy and the patient has not been interested anything beyond albuterol.   On 2/5 he presented to [facility] ED with complaints of dyspnea and wheezing x several hours. He called EMS. Upon arrival he was found to be hypoxemic to the 80s on room air. This improved with supplemental O2. He was treated for asthma exacerbation and CAP with steroids, albuterol, and ctx/azithro. PCCM consulted.   Past Medical History  Asthma  Significant Hospital Events     Consults:    Procedures:    Significant Diagnostic Tests:  CT chest 06/01/19 > Improvement in peribronchovascular areas of consolidation with persistent extensive fluid and/or mucus throughout distended bronchi  Micro Data:    Antimicrobials:  CTX, Azithro 2/5   Interim history/subjective:    Objective   Blood pressure 127/81, pulse 95, temperature 98.1 F (36.7 C), temperature source Oral, resp. rate 14, SpO2 100 %.        Intake/Output Summary (Last 24 hours) at 09/10/2019 1236 Last data filed at 09/10/2019 1228 Gross per 24 hour  Intake 350 ml  Output --  Net 350  ml   There were no vitals filed for this visit.  Examination: General: middle aged male of normal body habitus in NAD HENT: NA/AT, PERRl, No JVD Lungs: Diminished bases. No wheeze Cardiovascular: RRR, no MRG Abdomen: Soft, non-tender, non-distended Extremities: No acute deformity or ROM limitation. No edema.  Neuro: Alert, oriented, non-focal   Resolved Hospital Problem list     Assessment & Plan:   Asthma: acute exacerbation. The patient is now satting in the high 90s on rrom air and is no longer wheezing. 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  - discharge home with doxycycline and predniosne taper - follow up visit arranged in pulmonary office for 2/19 - will ultimately need PFT  - see attending attestation   Labs   CBC: Recent Labs  Lab 09/10/19 1006  WBC 8.8  HGB 13.9  HCT 41.6  MCV 84.0  PLT 563    Basic Metabolic Panel: No results for input(s): NA, K, CL, CO2, GLUCOSE, BUN, CREATININE, CALCIUM, MG, PHOS in the last 168 hours. GFR: CrCl cannot be calculated (Patient's most recent lab result is older than the maximum 21 days allowed.). Recent Labs  Lab 09/10/19 1006  WBC 8.8  LATICACIDVEN 1.5    Liver Function Tests: No results for input(s): AST, ALT, ALKPHOS, BILITOT, PROT, ALBUMIN in the last 168 hours. No results for input(s): LIPASE, AMYLASE in the last 168 hours. No results for input(s): AMMONIA in the last 168 hours.  ABG No results found  for: PHART, PCO2ART, PO2ART, HCO3, TCO2, ACIDBASEDEF, O2SAT   Coagulation Profile: No results for input(s): INR, PROTIME in the last 168 hours.  Cardiac Enzymes: No results for input(s): CKTOTAL, CKMB, CKMBINDEX, TROPONINI in the last 168 hours.  HbA1C: No results found for: HGBA1C  CBG: No results for input(s): GLUCAP in the last 168 hours.  Review of Systems:   Bolds are positive  Constitutional: weight loss, gain, night sweats, Fevers, chills, fatigue .  HEENT: headaches, Sore throat,  sneezing, nasal congestion, post nasal drip, Difficulty swallowing, Tooth/dental problems, visual complaints visual changes, ear ache CV:  chest pain, radiates:,Orthopnea, PND, swelling in lower extremities, dizziness, palpitations, syncope.  GI  heartburn, indigestion, abdominal pain, nausea, vomiting, diarrhea, change in bowel habits, loss of appetite, bloody stools.  Resp: cough, productive: , hemoptysis, dyspnea, chest pain, pleuritic.  Skin: rash or itching or icterus GU: dysuria, change in color of urine, urgency or frequency. flank pain, hematuria  MS: joint pain or swelling. decreased range of motion  Psych: change in mood or affect. depression or anxiety.  Neuro: difficulty with speech, weakness, numbness, ataxia    Past Medical History  He,  has a past medical history of Asthma.   Surgical History   History reviewed. No pertinent surgical history.   Social History   reports that he has never smoked. He has never used smokeless tobacco. He reports previous alcohol use. He reports that he does not use drugs.   Family History   His family history is not on file.   Allergies No Known Allergies   Home Medications  Prior to Admission medications   Medication Sig Start Date End Date Taking? Authorizing Provider  albuterol (VENTOLIN HFA) 108 (90 Base) MCG/ACT inhaler Inhale 2 puffs into the lungs every 6 (six) hours as needed for wheezing or shortness of breath. 08/08/19   Tomma Lightning, MD  albuterol (VENTOLIN HFA) 108 (90 Base) MCG/ACT inhaler TAKE 2 PUFFS BY MOUTH EVERY 6 HOURS AS NEEDED FOR WHEEZE OR SHORTNESS OF BREATH 09/09/19   Tomma Lightning, MD     Joneen Roach, AGACNP-BC [facility]  See Amion for personal pager PCCM on call pager [phone]  09/10/2019 1:32 PM
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
## 2019-09-10 NOTE — Discharge Instructions (Addendum)
Take all medications as prescribed Return to the emergency department if you have worsening shortness of breath or wheezing Follow-up is being arranged with your lung doctors.

## 2019-09-10 NOTE — ED Notes (Signed)
Pt reported he did not want IV treatment.

## 2019-09-10 NOTE — ED Provider Notes (Signed)
[facility] EMERGENCY DEPARTMENT Provider Note   CSN: [id number] Arrival date & time: 09/10/19  0845     History Chief Complaint  Patient presents with  . Asthma    Nicholas Ochoa is a 39 y.o. male.  HPI 39 year old male history of asthma, community-acquired pneumonia presents today with increased wheezing and dyspnea that began this a.m.  He has some of his albuterol HFA remaining was able to use 4 puffs without relief.  On EMS arrival, they report, that his sats were in the 80s.  He received nasal cannula and albuterol with increased oxygen saturations.  Patient reports that he began wheezing this morning.  He denies any fever or chills.  He has cough productive of some sputum.  He states he has not been exposed to Covid, but does state that he lives with multiple people, not all of whom he is acquainted with.  He denies smoking.  He denies fever, chills, neck pain, nausea, vomiting, or diarrhea.  Past Medical History:  Diagnosis Date  . Asthma     There are no problems to display for this patient.   History reviewed. No pertinent surgical history.     History reviewed. No pertinent family history.  Social History   Tobacco Use  . Smoking status: Never Smoker  . Smokeless tobacco: Never Used  Substance Use Topics  . Alcohol use: Not Currently  . Drug use: Never    Home Medications Prior to Admission medications   Medication Sig Start Date End Date Taking? Authorizing Provider  albuterol (VENTOLIN HFA) 108 (90 Base) MCG/ACT inhaler Inhale 2 puffs into the lungs every 6 (six) hours as needed for wheezing or shortness of breath. 08/08/19   Olalere, Onnie Boer A, MD  albuterol (VENTOLIN HFA) 108 (90 Base) MCG/ACT inhaler TAKE 2 PUFFS BY MOUTH EVERY 6 HOURS AS NEEDED FOR WHEEZE OR SHORTNESS OF BREATH 09/09/19   Tomma Lightning, MD    Allergies    Patient has no known allergies.  Review of Systems   Review of Systems  All other systems reviewed and are  negative.   Physical Exam Updated Vital Signs BP (!) 144/106   Pulse (!) 112   Temp 98.1 F (36.7 C) (Oral)   Resp 20   SpO2 95% Comment: 2 liter  Physical Exam Vitals and nursing note reviewed.  Constitutional:      General: He is in acute distress.     Appearance: Normal appearance.  HENT:     Head: Normocephalic.     Right Ear: External ear normal.     Left Ear: External ear normal.     Nose: Nose normal.  Eyes:     Pupils: Pupils are equal, round, and reactive to light.  Cardiovascular:     Rate and Rhythm: Regular rhythm. Tachycardia present.  Pulmonary:     Comments: Patient is tachypneic with some accessory muscle use.  He has inspiratory and expiratory wheezes throughout Abdominal:     General: Abdomen is flat.     Palpations: Abdomen is soft.  Musculoskeletal:        General: No swelling. Normal range of motion.     Cervical back: Normal range of motion and neck supple.  Skin:    General: Skin is warm.     Capillary Refill: Capillary refill takes less than 2 seconds.  Neurological:     General: No focal deficit present.     Mental Status: He is alert.  Psychiatric:  Mood and Affect: Mood normal.     ED Results / Procedures / Treatments   Labs (all labs ordered are listed, but only abnormal results are displayed) Labs Reviewed  POC SARS CORONAVIRUS 2 AG -  ED    EKG None  Radiology DG Chest Port 1 View  Result Date: 09/10/2019 CLINICAL DATA:  Dyspnea, short of breath, asthma EXAM: PORTABLE CHEST 1 VIEW COMPARISON:  04/03/2019 FINDINGS: Single frontal view of the chest demonstrates a stable cardiac silhouette. The left basilar airspace disease seen previously demonstrates moderate improvement. There is new right basilar airspace disease. No effusion or pneumothorax. No acute bony abnormalities. IMPRESSION: 1. Waxing and waning bibasilar airspace disease as above. Differential includes new multifocal pneumonia, mucous plugging, or more chronic  indolent infection with atypical organisms such as fungal pneumonia given reactive airway finding seen on prior CT. Pulmonology consultation recommended if not previously performed. Bronchoscopy may be useful. Electronically Signed   By: Randa Ngo M.D.   On: 09/10/2019 09:21    Procedures Procedures (including critical care time)  Medications Ordered in ED Medications  albuterol (VENTOLIN HFA) 108 (90 Base) MCG/ACT inhaler 2 puff (has no administration in time range)  AeroChamber Plus Flo-Vu Large MISC 1 each (has no administration in time range)  methylPREDNISolone sodium succinate (SOLU-MEDROL) 125 mg/2 mL injection 125 mg (has no administration in time range)  sodium chloride 0.9 % bolus 1,000 mL (has no administration in time range)  Patient refused IV.  Solu-Medrol changed to p.o. prednisone.  ED Course  I have reviewed the triage vital signs and the nursing notes.  Pertinent labs & imaging results that were available during my care of the patient were reviewed by me and considered in my medical decision making (see chart for details). 9:40 AM Reviewed chest x-Nohemi Nicklaus and discussed with patient.  He will allow IV placement at this time Rocephin and Zithromax ordered HIV antibody ordered Point-of-care Covid test negative, PCR test added\ 12:20 PM Discussed  with pulmonary care Patient satting 96% on nasal cannula Nasal cannula discontinued and will reassess oxygenation   MDM Rules/Calculators/A&P                      Patient seen by pccm, and advise patient appears stble for d/c with stable sats now off oxygen and in upper 90s.   Plan prednisone and doyx and they will arrange op f/u Discussed return precautions and need for f/u and patient voices understanding  Final Clinical Impression(s) / ED Diagnoses Final diagnoses:  Moderate asthma with exacerbation, unspecified whether persistent  Community acquired pneumonia, unspecified laterality    Rx / DC Orders ED Discharge  Orders    None       Pattricia Boss, MD 09/10/19 1254

## 2019-09-15 LAB — CULTURE, BLOOD (ROUTINE X 2)
Culture: NO GROWTH
Culture: NO GROWTH

## 2019-09-20 ENCOUNTER — Other Ambulatory Visit: Payer: Self-pay

## 2019-09-21 ENCOUNTER — Ambulatory Visit (INDEPENDENT_AMBULATORY_CARE_PROVIDER_SITE_OTHER): Payer: Self-pay

## 2019-09-21 ENCOUNTER — Ambulatory Visit: Payer: Self-pay

## 2019-09-21 ENCOUNTER — Encounter: Payer: Self-pay | Admitting: Nurse Practitioner

## 2019-09-21 ENCOUNTER — Ambulatory Visit (INDEPENDENT_AMBULATORY_CARE_PROVIDER_SITE_OTHER): Payer: Self-pay | Admitting: Nurse Practitioner

## 2019-09-21 VITALS — BP 102/78 | HR 79 | Temp 97.1°F | Ht 70.59 in | Wt 157.4 lb

## 2019-09-21 DIAGNOSIS — Z1211 Encounter for screening for malignant neoplasm of colon: Secondary | ICD-10-CM

## 2019-09-21 DIAGNOSIS — J189 Pneumonia, unspecified organism: Secondary | ICD-10-CM

## 2019-09-21 DIAGNOSIS — Z125 Encounter for screening for malignant neoplasm of prostate: Secondary | ICD-10-CM

## 2019-09-21 DIAGNOSIS — Z13220 Encounter for screening for lipoid disorders: Secondary | ICD-10-CM

## 2019-09-21 DIAGNOSIS — Z0000 Encounter for general adult medical examination without abnormal findings: Secondary | ICD-10-CM

## 2019-09-21 DIAGNOSIS — Z136 Encounter for screening for cardiovascular disorders: Secondary | ICD-10-CM

## 2019-09-21 NOTE — Progress Notes (Signed)
Subjective:    Patient ID: Nicholas Ochoa, male    DOB: 1980/08/09, 39 y.o.   MRN: [id number]  Patient presents today for complete physical and re eval of CAP.  Unemployed, lives alone.  HPI  CAP: Diagnosed with multifocal pneumonia on 09/10/2019. Completed doxycycline and prednisone as prescribed. Denies any fever or cough or SOB. Has upcoming appt with pulmonology 09/24/2019  Sexual History (orientation,birth control, marital status, STD):single, no currently sexually active, denies need for STD screen.  Depression/Suicide: Depression screen PHQ 2/9 09/21/2019  Decreased Interest 0  Down, Depressed, Hopeless 0  PHQ - 2 Score 0   Vision:not insurance coverage  Dental:upcoming appt  Immunizations: (TDAP, Hep C screen, Pneumovax, Influenza, zoster)  Health Maintenance  Topic Date Due  . Flu Shot  11/03/2019*  . Tetanus Vaccine  09/20/2020*  . HIV Screening  Completed  *Topic was postponed. The date shown is not the original due date.   Diet:regular.  Weight:  Wt Readings from Last 3 Encounters:  09/21/19 157 lb 6.4 oz (71.4 kg)  06/02/19 146 lb 6.4 oz (66.4 kg)  04/19/19 144 lb 9.6 oz (65.6 kg)   Exercise:none  Fall Risk: Fall Risk  09/21/2019  Falls in the past year? 0   Advanced Directive: Advanced Directives 09/10/2019  Does Patient Have a Medical Advance Directive? No  Would patient like information on creating a medical advance directive? Yes (ED - Information included in AVS)    Medications and allergies reviewed with patient and updated if appropriate.  There are no problems to display for this patient.   Current Outpatient Medications on File Prior to Visit  Medication Sig Dispense Refill  . albuterol (VENTOLIN HFA) 108 (90 Base) MCG/ACT inhaler Inhale 1-2 puffs into the lungs every 6 (six) hours as needed for wheezing or shortness of breath. 1 g 0  . doxycycline (VIBRAMYCIN) 100 MG capsule Take 1 capsule (100 mg total) by mouth 2 (two) times daily. (Patient  not taking: Reported on 09/21/2019) 20 capsule 0   No current facility-administered medications on file prior to visit.    Past Medical History:  Diagnosis Date  . Asthma     Past Surgical History:  Procedure Laterality Date  . TONSILLECTOMY      Social History   Socioeconomic History  . Marital status: Single    Spouse name: Not on file  . Number of children: Not on file  . Years of education: Not on file  . Highest education level: Not on file  Occupational History  . Not on file  Tobacco Use  . Smoking status: Never Smoker  . Smokeless tobacco: Never Used  Substance and Sexual Activity  . Alcohol use: Not Currently  . Drug use: Never  . Sexual activity: Yes  Other Topics Concern  . Not on file  Social History Narrative  . Not on file   Social Determinants of Health   Financial Resource Strain:   . Difficulty of Paying Living Expenses: Not on file  Food Insecurity:   . Worried About Programme researcher, broadcasting/film/video in the Last Year: Not on file  . Ran Out of Food in the Last Year: Not on file  Transportation Needs:   . Lack of Transportation (Medical): Not on file  . Lack of Transportation (Non-Medical): Not on file  Physical Activity:   . Days of Exercise per Week: Not on file  . Minutes of Exercise per Session: Not on file  Stress:   . Feeling of Stress :  Not on file  Social Connections:   . Frequency of Communication with Friends and Family: Not on file  . Frequency of Social Gatherings with Friends and Family: Not on file  . Attends Religious Services: Not on file  . Active Member of Clubs or Organizations: Not on file  . Attends Archivist Meetings: Not on file  . Marital Status: Not on file    Family History  Family history unknown: Yes        Review of Systems  Constitutional: Negative for fever, malaise/fatigue and weight loss.  HENT: Negative for congestion and sore throat.   Eyes:       Negative for visual changes  Respiratory: Negative  for cough and shortness of breath.   Cardiovascular: Negative for chest pain, palpitations and leg swelling.  Gastrointestinal: Negative for blood in stool, constipation, diarrhea and heartburn.  Genitourinary: Negative for dysuria, frequency and urgency.  Musculoskeletal: Negative for falls, joint pain and myalgias.  Skin: Negative for rash.  Neurological: Negative for dizziness, sensory change and headaches.  Endo/Heme/Allergies: Does not bruise/bleed easily.  Psychiatric/Behavioral: Negative for depression, substance abuse and suicidal ideas. The patient is not nervous/anxious.     Objective:   Vitals:   09/21/19 1030  BP: 102/78  Pulse: 79  Temp: (!) 97.1 F (36.2 C)  SpO2: 94%   Body mass index is 22.21 kg/m.  Physical Examination:  Physical Exam Vitals reviewed.  Constitutional:      General: He is not in acute distress.    Appearance: He is well-developed.  HENT:     Right Ear: Tympanic membrane, ear canal and external ear normal.     Left Ear: Tympanic membrane, ear canal and external ear normal.  Eyes:     Extraocular Movements: Extraocular movements intact.     Conjunctiva/sclera: Conjunctivae normal.     Pupils: Pupils are equal, round, and reactive to light.  Cardiovascular:     Rate and Rhythm: Normal rate and regular rhythm.     Pulses: Normal pulses.     Heart sounds: Normal heart sounds.  Pulmonary:     Effort: Pulmonary effort is normal. No respiratory distress.     Breath sounds: Normal breath sounds.  Chest:     Chest wall: No tenderness.  Abdominal:     General: Bowel sounds are normal. There is no distension.     Palpations: Abdomen is soft.     Tenderness: There is no abdominal tenderness.  Musculoskeletal:        General: Normal range of motion.     Cervical back: Normal range of motion and neck supple.     Right lower leg: No edema.     Left lower leg: No edema.  Lymphadenopathy:     Cervical: No cervical adenopathy.  Skin:     General: Skin is warm and dry.     Findings: No rash.  Neurological:     Mental Status: He is alert and oriented to person, place, and time.     Deep Tendon Reflexes: Reflexes are normal and symmetric.  Psychiatric:        Attention and Perception: Attention normal.        Mood and Affect: Mood normal.        Speech: Speech normal.        Behavior: Behavior normal.        Cognition and Memory: Cognition normal.    ASSESSMENT and PLAN: This visit occurred during the SARS-CoV-2 public health  emergency.  Safety protocols were in place, including screening questions prior to the visit, additional usage of staff PPE, and extensive cleaning of exam room while observing appropriate contact time as indicated for disinfecting solutions.   Robbin was seen today for annual exam.  Diagnoses and all orders for this visit:  Community acquired pneumonia, unspecified laterality -     Cancel: DG Chest 2 View -     DG Chest 2 View; Future -     DG Chest 2 View  Encounter for lipid screening for cardiovascular disease -     Lipid panel; Future  Colon cancer screening -     Ambulatory referral to Gastroenterology  Prostate cancer screening -     PSA; Future  Preventative health care -     Lipid panel; Future -     TSH; Future -     Ambulatory referral to Gastroenterology -     PSA; Future    No problem-specific Assessment & Plan notes found for this encounter.      Problem List Items Addressed This Visit    None    Visit Diagnoses    Community acquired pneumonia, unspecified laterality    -  Primary   Relevant Orders   DG Chest 2 View (Completed)   Encounter for lipid screening for cardiovascular disease       Relevant Orders   Lipid panel   Colon cancer screening       Relevant Orders   Ambulatory referral to Gastroenterology   Prostate cancer screening       Relevant Orders   PSA   Preventative health care       Relevant Orders   Lipid panel   TSH   Ambulatory referral  to Gastroenterology   PSA      Follow up: Return if symptoms worsen or fail to improve.  Alysia Penna, NP

## 2019-09-21 NOTE — Patient Instructions (Addendum)
Go to lab for repeat CXR today. Schedule lab appt. You need to be fasting at least 6hrs prior to blood draw (ok to drink water).  Thank you for Choosing [facility] for your health needs   Health Maintenance, Male Adopting a healthy lifestyle and getting preventive care are important in promoting health and wellness. Ask your health care provider about:  The right schedule for you to have regular tests and exams.  Things you can do on your own to prevent diseases and keep yourself healthy. What should I know about diet, weight, and exercise? Eat a healthy diet   Eat a diet that includes plenty of vegetables, fruits, low-fat dairy products, and lean protein.  Do not eat a lot of foods that are high in solid fats, added sugars, or sodium. Maintain a healthy weight Body mass index (BMI) is a measurement that can be used to identify possible weight problems. It estimates body fat based on height and weight. Your health care provider can help determine your BMI and help you achieve or maintain a healthy weight. Get regular exercise Get regular exercise. This is one of the most important things you can do for your health. Most adults should:  Exercise for at least 150 minutes each week. The exercise should increase your heart rate and make you sweat (moderate-intensity exercise).  Do strengthening exercises at least twice a week. This is in addition to the moderate-intensity exercise.  Spend less time sitting. Even light physical activity can be beneficial. Watch cholesterol and blood lipids Have your blood tested for lipids and cholesterol at 41 years of age, then have this test every 5 years. You may need to have your cholesterol levels checked more often if:  Your lipid or cholesterol levels are high.  You are older than 41 years of age.  You are at high risk for heart disease. What should I know about cancer screening? Many types of cancers can be detected early and may  often be prevented. Depending on your health history and family history, you may need to have cancer screening at various ages. This may include screening for:  Colorectal cancer.  Prostate cancer.  Skin cancer.  Lung cancer. What should I know about heart disease, diabetes, and high blood pressure? Blood pressure and heart disease  High blood pressure causes heart disease and increases the risk of stroke. This is more likely to develop in people who have high blood pressure readings, are of African descent, or are overweight.  Talk with your health care provider about your target blood pressure readings.  Have your blood pressure checked: ? Every 3-5 years if you are 52-27 years of age. ? Every year if you are 71 years old or older.  If you are between the ages of 71 and 60 and are a current or former smoker, ask your health care provider if you should have a one-time screening for abdominal aortic aneurysm (AAA). Diabetes Have regular diabetes screenings. This checks your fasting blood sugar level. Have the screening done:  Once every three years after age 26 if you are at a normal weight and have a low risk for diabetes.  More often and at a younger age if you are overweight or have a high risk for diabetes. What should I know about preventing infection? Hepatitis B If you have a higher risk for hepatitis B, you should be screened for this virus. Talk with your health care provider to find out if you are  at risk for hepatitis B infection. Hepatitis C Blood testing is recommended for:  Everyone born from [birth year] through 1967.  Anyone with known risk factors for hepatitis C. Sexually transmitted infections (STIs)  You should be screened each year for STIs, including gonorrhea and chlamydia, if: ? You are sexually active and are younger than 41 years of age. ? You are older than 41 years of age and your health care provider tells you that you are at risk for this type of  infection. ? Your sexual activity has changed since you were last screened, and you are at increased risk for chlamydia or gonorrhea. Ask your health care provider if you are at risk.  Ask your health care provider about whether you are at high risk for HIV. Your health care provider may recommend a prescription medicine to help prevent HIV infection. If you choose to take medicine to prevent HIV, you should first get tested for HIV. You should then be tested every 3 months for as long as you are taking the medicine. Follow these instructions at home: Lifestyle  Do not use any products that contain nicotine or tobacco, such as cigarettes, e-cigarettes, and chewing tobacco. If you need help quitting, ask your health care provider.  Do not use street drugs.  Do not share needles.  Ask your health care provider for help if you need support or information about quitting drugs. Alcohol use  Do not drink alcohol if your health care provider tells you not to drink.  If you drink alcohol: ? Limit how much you have to 0-2 drinks a day. ? Be aware of how much alcohol is in your drink. In the [place] one drink equals one 12 oz bottle of beer (355 mL), one 5 oz glass of wine (148 mL), or one 1 oz glass of hard liquor (44 mL). General instructions  Schedule regular health, dental, and eye exams.  Stay current with your vaccines.  Tell your health care provider if: ? You often feel depressed. ? You have ever been abused or do not feel safe at home. Summary  Adopting a healthy lifestyle and getting preventive care are important in promoting health and wellness.  Follow your health care provider's instructions about healthy diet, exercising, and getting tested or screened for diseases.  Follow your health care provider's instructions on monitoring your cholesterol and blood pressure. This information is not intended to replace advice given to you by your health care provider. Make sure you  discuss any questions you have with your health care provider. Document Revised: 07/15/2018 Document Reviewed: 07/15/2018 Elsevier Patient Education  2020 [employer].

## 2019-09-23 ENCOUNTER — Encounter: Payer: Self-pay | Admitting: Nurse Practitioner

## 2019-09-24 ENCOUNTER — Inpatient Hospital Stay: Payer: Self-pay | Admitting: Pulmonary Disease

## 2019-09-25 ENCOUNTER — Encounter: Payer: Self-pay | Admitting: Nurse Practitioner

## 2019-09-25 DIAGNOSIS — J189 Pneumonia, unspecified organism: Secondary | ICD-10-CM

## 2019-09-27 MED ORDER — ALBUTEROL SULFATE HFA 108 (90 BASE) MCG/ACT IN AERS: 1.0000 | INHALATION_SPRAY | Freq: Four times a day (QID) | RESPIRATORY_TRACT | 0 refills | Status: DC | PRN

## 2019-09-28 ENCOUNTER — Ambulatory Visit (INDEPENDENT_AMBULATORY_CARE_PROVIDER_SITE_OTHER): Payer: Self-pay | Admitting: Pulmonary Disease

## 2019-09-28 ENCOUNTER — Encounter: Payer: Self-pay | Admitting: Pulmonary Disease

## 2019-09-28 VITALS — BP 110/70 | HR 99 | Temp 97.9°F | Ht 70.5 in | Wt 159.8 lb

## 2019-09-28 DIAGNOSIS — J189 Pneumonia, unspecified organism: Secondary | ICD-10-CM

## 2019-09-28 DIAGNOSIS — J4520 Mild intermittent asthma, uncomplicated: Secondary | ICD-10-CM

## 2019-09-28 NOTE — Patient Instructions (Signed)
Recent pneumonia Improving symptoms  If you come down with another infection much too soon, we should consider a CT scan of your chest and possibly a bronchoscopy as we have discussed in the past   Call with any significant concerns  I will see you back in 3 months

## 2019-09-28 NOTE — Progress Notes (Signed)
Subjective:     Patient ID: Nicholas Ochoa, male   DOB: [birth year], 39 y.o.   MRN: [id number]  Was recently evaluated in the emergency room with shortness of breath Treated for pneumonia  He did have a recent follow-up chest x-ray which showed resolution of the infiltrative process according to patient Chest x-ray is not available to me  I did discuss my concern about him coming down with an infectious pulmonary process frequently  He stated this may be related to him having asthma background and is living environment lives in exposed to mold, some other particulates which seem to trigger his symptoms  His symptoms continue to improve at present  He still has a cough, bringing up clear phlegm, he has no fever or chills Appetite is good No weight loss   Denies any history of immunocompromise Stated he had an HIV testing performed about 5-6 years ago and was negative-has not been active  Never smoker No history of illicit drug use  No pets  He works Architect  Albuterol seems to work well enough for him at present  Past Medical History:  Diagnosis Date  . Asthma    Social History   Socioeconomic History  . Marital status: Single    Spouse name: Not on file  . Number of children: 0  . Years of education: Not on file  . Highest education level: 10th grade  Occupational History    Comment: Unemployed  Tobacco Use  . Smoking status: Never Smoker  . Smokeless tobacco: Never Used  Substance and Sexual Activity  . Alcohol use: Not Currently  . Drug use: Never  . Sexual activity: Not Currently  Other Topics Concern  . Not on file  Social History Narrative   Unemployed, lives alone   Social Determinants of Health   Financial Resource Strain:   . Difficulty of Paying Living Expenses: Not on file  Food Insecurity:   . Worried About Charity fundraiser in the Last Year: Not on file  . Ran Out of Food in the Last Year: Not on file  Transportation Needs:   . Lack of  Transportation (Medical): Not on file  . Lack of Transportation (Non-Medical): Not on file  Physical Activity:   . Days of Exercise per Week: Not on file  . Minutes of Exercise per Session: Not on file  Stress:   . Feeling of Stress : Not on file  Social Connections:   . Frequency of Communication with Friends and Family: Not on file  . Frequency of Social Gatherings with Friends and Family: Not on file  . Attends Religious Services: Not on file  . Active Member of Clubs or Organizations: Not on file  . Attends Archivist Meetings: Not on file  . Marital Status: Not on file  Intimate Partner Violence:   . Fear of Current or Ex-Partner: Not on file  . Emotionally Abused: Not on file  . Physically Abused: Not on file  . Sexually Abused: Not on file      Review of Systems  Constitutional: Negative for fever and unexpected weight change.  HENT: Positive for dental problem. Negative for congestion, ear pain, nosebleeds, postnasal drip, rhinorrhea, sinus pressure, sneezing, sore throat and trouble swallowing.   Eyes: Negative for redness and itching.  Respiratory: Positive for cough. Negative for chest tightness, shortness of breath and wheezing.   Cardiovascular: Negative for palpitations and leg swelling.  Gastrointestinal: Negative for nausea and vomiting.  Skin: Negative for  rash.  Allergic/Immunologic: Negative.       Objective:   Physical Exam Constitutional:      Appearance: Normal appearance.  HENT:     Head: Normocephalic and atraumatic.     Nose: Nose normal. No congestion or rhinorrhea.     Mouth/Throat:     Mouth: Mucous membranes are moist.  Eyes:     General:        Right eye: No discharge.        Left eye: No discharge.     Extraocular Movements: Extraocular movements intact.     Pupils: Pupils are equal, round, and reactive to light.  Cardiovascular:     Rate and Rhythm: Normal rate and regular rhythm.     Heart sounds: No murmur. No friction  rub.  Pulmonary:     Effort: Pulmonary effort is normal. No respiratory distress.     Breath sounds: No stridor. Rhonchi present. No wheezing.  Musculoskeletal:     Cervical back: Normal range of motion and neck supple. No rigidity. No muscular tenderness.  Lymphadenopathy:     Cervical: No cervical adenopathy.  Neurological:     Deep Tendon Reflexes: Abnormal reflex:      Vitals:   09/28/19 1437  BP: 110/70  Pulse: 99  Temp: 97.9 F (36.6 C)  SpO2: 95%   Most recent chest x-ray shows a right lower lobe infiltrate Patient reports that repeat chest x-ray did show resolution-this x-ray is not available to me at present  Previous CT scan was reviewed by myself  Assessment:     Recent pneumonia -Symptoms are improving  Mucous plugging -Possibility of atypical mycobacterial infection -This was revisited -With symptoms improving on the present time -I did encourage him to call if he has any reexacerbation and at that time we may need to repeat his chest CT scan and possibly consider bronchoscopy  Clinically feeling better    History of asthma-continue albuterol as needed  Bronchoscopy discussed  Plan:     CT follow-up should be considered if he has any exacerbation in short order  Encouraged to call if he changes his mind about this bronchoscopy  We will see him back in the office following repeat CT scan of the chest Continue with inhalers for asthma-only using albuterol at present  Encouraged to call with any significant concerns

## 2019-10-05 ENCOUNTER — Other Ambulatory Visit: Payer: Self-pay

## 2019-10-19 ENCOUNTER — Other Ambulatory Visit: Payer: Self-pay

## 2019-10-25 ENCOUNTER — Other Ambulatory Visit: Payer: Self-pay | Admitting: Nurse Practitioner

## 2019-10-25 DIAGNOSIS — J189 Pneumonia, unspecified organism: Secondary | ICD-10-CM

## 2019-10-28 ENCOUNTER — Other Ambulatory Visit: Payer: Self-pay

## 2019-10-29 ENCOUNTER — Encounter: Payer: Self-pay | Admitting: Nurse Practitioner

## 2019-11-01 ENCOUNTER — Other Ambulatory Visit: Payer: Self-pay

## 2019-11-09 ENCOUNTER — Encounter (HOSPITAL_COMMUNITY): Payer: Self-pay | Admitting: Emergency Medicine

## 2019-11-09 ENCOUNTER — Emergency Department (HOSPITAL_COMMUNITY): Payer: Self-pay

## 2019-11-09 ENCOUNTER — Encounter: Payer: Self-pay | Admitting: Nurse Practitioner

## 2019-11-09 ENCOUNTER — Observation Stay (HOSPITAL_COMMUNITY)
Admission: EM | Admit: 2019-11-09 | Discharge: 2019-11-10 | Discharge status: Against Medical Advice | Payer: Self-pay | Attending: Internal Medicine | Admitting: Internal Medicine

## 2019-11-09 ENCOUNTER — Other Ambulatory Visit: Payer: Self-pay

## 2019-11-09 DIAGNOSIS — Z20822 Contact with and (suspected) exposure to covid-19: Secondary | ICD-10-CM | POA: Insufficient documentation

## 2019-11-09 DIAGNOSIS — J9601 Acute respiratory failure with hypoxia: Secondary | ICD-10-CM | POA: Insufficient documentation

## 2019-11-09 DIAGNOSIS — J189 Pneumonia, unspecified organism: Secondary | ICD-10-CM

## 2019-11-09 DIAGNOSIS — J45901 Unspecified asthma with (acute) exacerbation: Principal | ICD-10-CM | POA: Diagnosis present

## 2019-11-09 DIAGNOSIS — J45909 Unspecified asthma, uncomplicated: Secondary | ICD-10-CM

## 2019-11-09 LAB — CBC WITH DIFFERENTIAL/PLATELET
Abs Immature Granulocytes: 0.02 10*3/uL (ref 0.00–0.07)
Basophils Absolute: 0 10*3/uL (ref 0.0–0.1)
Basophils Relative: 0 %
Eosinophils Absolute: 1.8 10*3/uL — ABNORMAL HIGH (ref 0.0–0.5)
Eosinophils Relative: 17 %
HCT: 42.2 % (ref 39.0–52.0)
Hemoglobin: 14.1 g/dL (ref 13.0–17.0)
Immature Granulocytes: 0 %
Lymphocytes Relative: 29 %
Lymphs Abs: 3.1 10*3/uL (ref 0.7–4.0)
MCH: 29 pg (ref 26.0–34.0)
MCHC: 33.4 g/dL (ref 30.0–36.0)
MCV: 86.7 fL (ref 80.0–100.0)
Monocytes Absolute: 0.7 10*3/uL (ref 0.1–1.0)
Monocytes Relative: 6 %
Neutro Abs: 5.1 10*3/uL (ref 1.7–7.7)
Neutrophils Relative %: 48 %
Platelets: 257 10*3/uL (ref 150–400)
RBC: 4.87 MIL/uL (ref 4.22–5.81)
RDW: 13.6 % (ref 11.5–15.5)
WBC: 10.8 10*3/uL — ABNORMAL HIGH (ref 4.0–10.5)
nRBC: 0 % (ref 0.0–0.2)

## 2019-11-09 LAB — BASIC METABOLIC PANEL
Anion gap: 8 (ref 5–15)
BUN: 16 mg/dL (ref 6–20)
CO2: 26 mmol/L (ref 22–32)
Calcium: 9 mg/dL (ref 8.9–10.3)
Chloride: 98 mmol/L (ref 98–111)
Creatinine, Ser: 1.47 mg/dL — ABNORMAL HIGH (ref 0.61–1.24)
GFR calc Af Amer: >60 mL/min (ref >60)
GFR calc non Af Amer: 58 mL/min — ABNORMAL LOW (ref >60)
Glucose, Bld: 97 mg/dL (ref 70–99)
Potassium: 4.1 mmol/L (ref 3.5–5.1)
Sodium: 132 mmol/L — ABNORMAL LOW (ref 135–145)

## 2019-11-09 LAB — POC SARS CORONAVIRUS 2 AG -  ED: SARS Coronavirus 2 Ag: NEGATIVE

## 2019-11-09 MED ORDER — ALBUTEROL (5 MG/ML) CONTINUOUS INHALATION SOLN
10.0000 mg/h | INHALATION_SOLUTION | Freq: Once | RESPIRATORY_TRACT | Status: AC
  Administered 2019-11-10: 10 mg/h via RESPIRATORY_TRACT
  Filled 2019-11-09: qty 20

## 2019-11-09 MED ORDER — MAGNESIUM SULFATE 2 GM/50ML IV SOLN
2.0000 g | Freq: Once | INTRAVENOUS | Status: AC
  Administered 2019-11-09: 2 g via INTRAVENOUS
  Filled 2019-11-09: qty 50

## 2019-11-09 MED ORDER — METHYLPREDNISOLONE SODIUM SUCC 125 MG IJ SOLR
125.0000 mg | Freq: Once | INTRAMUSCULAR | Status: AC
  Administered 2019-11-09: 125 mg via INTRAVENOUS
  Filled 2019-11-09: qty 2

## 2019-11-09 MED ORDER — ALBUTEROL SULFATE (2.5 MG/3ML) 0.083% IN NEBU
5.0000 mg | INHALATION_SOLUTION | Freq: Once | RESPIRATORY_TRACT | Status: DC
  Filled 2019-11-09: qty 6

## 2019-11-09 MED ORDER — SODIUM CHLORIDE 0.9 % IV BOLUS
1000.0000 mL | Freq: Once | INTRAVENOUS | Status: AC
  Administered 2019-11-09: 1000 mL via INTRAVENOUS

## 2019-11-09 MED ORDER — IPRATROPIUM BROMIDE HFA 17 MCG/ACT IN AERS
2.0000 | INHALATION_SPRAY | Freq: Once | RESPIRATORY_TRACT | Status: AC
  Administered 2019-11-09: 2 via RESPIRATORY_TRACT
  Filled 2019-11-09: qty 12.9

## 2019-11-09 MED ORDER — ALBUTEROL SULFATE HFA 108 (90 BASE) MCG/ACT IN AERS
8.0000 | INHALATION_SPRAY | Freq: Once | RESPIRATORY_TRACT | Status: AC
  Administered 2019-11-09: 8 via RESPIRATORY_TRACT
  Filled 2019-11-09: qty 6.7

## 2019-11-09 NOTE — ED Provider Notes (Signed)
[facility]-EMERGENCY DEPT Provider Note   CSN: [id number] Arrival date & time: 11/09/19  2219     History Chief Complaint  Patient presents with  . Asthma    Nicholas Ochoa is a 39 y.o. male.  The history is provided by the patient.  Asthma This is a new problem. The current episode started 6 to 12 hours ago. The problem occurs constantly. The problem has not changed since onset.Associated symptoms include shortness of breath. Pertinent negatives include no chest pain, no abdominal pain and no headaches. The symptoms are aggravated by walking. Relieved by: inhaler. Treatments tried: inhaler. The treatment provided mild relief.       Past Medical History:  Diagnosis Date  . Asthma     There are no problems to display for this patient.   Past Surgical History:  Procedure Laterality Date  . TONSILLECTOMY         No family history on file.  Social History   Tobacco Use  . Smoking status: Never Smoker  . Smokeless tobacco: Never Used  Substance Use Topics  . Alcohol use: Not Currently  . Drug use: Never    Home Medications Prior to Admission medications   Medication Sig Start Date End Date Taking? Authorizing Provider  albuterol (VENTOLIN HFA) 108 (90 Base) MCG/ACT inhaler INHALE 1 PUFF INTO THE LUNGS EVERY 6 (SIX) HOURS AS NEEDED FOR WHEEZING OR SHORTNESS OF BREATH. 10/25/19  Yes Nche, Bonna Gains, NP    Allergies    Patient has no known allergies.  Review of Systems   Review of Systems  Constitutional: Negative for chills and fever.  HENT: Negative for ear pain and sore throat.   Eyes: Negative for pain and visual disturbance.  Respiratory: Positive for chest tightness, shortness of breath and wheezing. Negative for cough.   Cardiovascular: Negative for chest pain and palpitations.  Gastrointestinal: Negative for abdominal pain and vomiting.  Genitourinary: Negative for dysuria and hematuria.  Musculoskeletal: Negative for arthralgias and  back pain.  Skin: Negative for color change and rash.  Neurological: Negative for seizures, syncope and headaches.  All other systems reviewed and are negative.   Physical Exam Updated Vital Signs BP (!) 149/97 (BP Location: Left Arm)   Pulse (!) 117   Temp 98.4 F (36.9 C) (Oral)   Resp (!) 21   Ht 5\' 10"  (1.778 m)   Wt 72.6 kg   SpO2 90%   BMI 22.96 kg/m   Physical Exam Vitals and nursing note reviewed.  Constitutional:      General: He is not in acute distress.    Appearance: He is well-developed. He is not ill-appearing.  HENT:     Head: Normocephalic and atraumatic.     Mouth/Throat:     Mouth: Mucous membranes are dry.  Eyes:     Extraocular Movements: Extraocular movements intact.     Conjunctiva/sclera: Conjunctivae normal.     Pupils: Pupils are equal, round, and reactive to light.  Cardiovascular:     Rate and Rhythm: Normal rate and regular rhythm.     Pulses: Normal pulses.     Heart sounds: Normal heart sounds. No murmur.  Pulmonary:     Effort: No respiratory distress.     Breath sounds: Wheezing present.     Comments: Increased work of breathing, diminished air movement throughout, wheezing throughout, overall poor air movement Abdominal:     Palpations: Abdomen is soft.     Tenderness: There is no abdominal tenderness.  Musculoskeletal:        General: Normal range of motion.     Cervical back: Normal range of motion and neck supple.     Right lower leg: No edema.     Left lower leg: No edema.  Skin:    General: Skin is warm and dry.     Capillary Refill: Capillary refill takes less than 2 seconds.  Neurological:     General: No focal deficit present.     Mental Status: He is alert.  Psychiatric:        Mood and Affect: Mood normal.     ED Results / Procedures / Treatments   Labs (all labs ordered are listed, but only abnormal results are displayed) Labs Reviewed  CBC WITH DIFFERENTIAL/PLATELET - Abnormal; Notable for the following  components:      Result Value   WBC 10.8 (*)    Eosinophils Absolute 1.8 (*)    All other components within normal limits  BASIC METABOLIC PANEL - Abnormal; Notable for the following components:   Sodium 132 (*)    Creatinine, Ser 1.47 (*)    GFR calc non Af Amer 58 (*)    All other components within normal limits  SARS CORONAVIRUS 2 (TAT 6-24 HRS)  POC SARS CORONAVIRUS 2 AG -  ED    EKG EKG Interpretation  Date/Time:  Tuesday November 09 2019 22:39:35 EDT Ventricular Rate:  121 PR Interval:    QRS Duration: 152 QT Interval:  314 QTC Calculation: 446 R Axis:   85 Text Interpretation: Sinus tachycardia Nonspecific intraventricular conduction delay Confirmed by Lennice Sites [phone]) on 11/09/2019 10:43:56 PM   Radiology DG Chest Port 1 View  Result Date: 11/09/2019 CLINICAL DATA:  Asthma attack EXAM: PORTABLE CHEST 1 VIEW COMPARISON:  09/21/2019 FINDINGS: The heart size and mediastinal contours are within normal limits. Both lungs are clear. The visualized skeletal structures are unremarkable. IMPRESSION: Negative. Electronically Signed   By: Rolm Baptise M.D.   On: 11/09/2019 22:51    Procedures .Critical Care Performed by: Lennice Sites, DO Authorized by: Lennice Sites, DO   Critical care provider statement:    Critical care time (minutes):  40   Critical care was necessary to treat or prevent imminent or life-threatening deterioration of the following conditions:  Respiratory failure   Critical care was time spent personally by me on the following activities:  Blood draw for specimens, development of treatment plan with patient or surrogate, discussions with primary provider, evaluation of patient's response to treatment, examination of patient, obtaining history from patient or surrogate, ordering and performing treatments and interventions, ordering and review of laboratory studies, ordering and review of radiographic studies, pulse oximetry, review of old charts and  re-evaluation of patient's condition   I assumed direction of critical care for this patient from another provider in my specialty: no     (including critical care time)  Medications Ordered in ED Medications  magnesium sulfate IVPB 2 g 50 mL (2 g Intravenous New Bag/Given 11/09/19 2317)  albuterol (PROVENTIL,VENTOLIN) solution continuous neb (has no administration in time range)  albuterol (VENTOLIN HFA) 108 (90 Base) MCG/ACT inhaler 8 puff (8 puffs Inhalation Given 11/09/19 2336)  ipratropium (ATROVENT HFA) inhaler 2 puff (2 puffs Inhalation Given 11/09/19 2337)  methylPREDNISolone sodium succinate (SOLU-MEDROL) 125 mg/2 mL injection 125 mg (125 mg Intravenous Given 11/09/19 2315)  sodium chloride 0.9 % bolus 1,000 mL (1,000 mLs Intravenous New Bag/Given 11/09/19 2315)    ED Course  I  have reviewed the triage vital signs and the nursing notes.  Pertinent labs & imaging results that were available during my care of the patient were reviewed by me and considered in my medical decision making (see chart for details).    MDM Rules/Calculators/A&P                      Elhadj Girton is a 39 year old male with history of asthma who presents to the ED with shortness of breath, wheezing.  Patient hypoxic upon arrival to the mid 80s.  Improved on 3 L of oxygen.  History of asthma attacks.  Denies fever, sputum production.  Rapid Covid test is negative.  Patient with no signs of volume overload.  EKG shows sinus rhythm.  No ischemic changes.  Denies any chest pain.  Appears to have severe asthma exacerbation.  Patient given 10 puffs of albuterol, Atrovent, IV steroids, IV magnesium, IV fluid bolus.  Chest x-ray with no signs of infection.  No significant anemia, electrolyte abnormality, kidney injury.  Patient on reevaluation appears to be improving.  Air movement is better.  Patient is with easier work of breathing.  Will place on continuous albuterol and have him admitted for further treatment given hypoxia.   This chart was dictated using voice recognition software.  Despite best efforts to proofread,  errors can occur which can change the documentation meaning.  Nicholas Ochoa was evaluated in Emergency Department on 11/09/2019 for the symptoms described in the history of present illness. He was evaluated in the context of the global COVID-19 pandemic, which necessitated consideration that the patient might be at risk for infection with the SARS-CoV-2 virus that causes COVID-19. Institutional protocols and algorithms that pertain to the evaluation of patients at risk for COVID-19 are in a state of rapid change based on information released by regulatory bodies including the CDC and federal and state organizations. These policies and algorithms were followed during the patient's care in the ED.    Final Clinical Impression(s) / ED Diagnoses Final diagnoses:  Mild asthma with exacerbation, unspecified whether persistent  Acute respiratory failure with hypoxia [facility])    Rx / DC Orders ED Discharge Orders    None       Virgina Norfolk, DO 11/09/19 2346

## 2019-11-09 NOTE — ED Triage Notes (Signed)
Patient presents complaining of an asthma attack. Patient states he has been short of breath for a few hours. Patient states that his inhaler is not working.

## 2019-11-10 ENCOUNTER — Inpatient Hospital Stay (HOSPITAL_COMMUNITY)
Admission: EM | Admit: 2019-11-10 | Discharge: 2019-11-14 | DRG: 202 | Disposition: A | Discharge status: Home / Self Care | Payer: Self-pay | Attending: Internal Medicine | Admitting: Internal Medicine

## 2019-11-10 ENCOUNTER — Encounter (HOSPITAL_COMMUNITY): Payer: Self-pay | Admitting: Emergency Medicine

## 2019-11-10 ENCOUNTER — Encounter (HOSPITAL_COMMUNITY): Payer: Self-pay | Admitting: Internal Medicine

## 2019-11-10 ENCOUNTER — Observation Stay (HOSPITAL_COMMUNITY): Payer: Self-pay

## 2019-11-10 ENCOUNTER — Other Ambulatory Visit: Payer: Self-pay

## 2019-11-10 ENCOUNTER — Emergency Department (HOSPITAL_COMMUNITY): Payer: Self-pay

## 2019-11-10 DIAGNOSIS — N179 Acute kidney failure, unspecified: Secondary | ICD-10-CM | POA: Diagnosis present

## 2019-11-10 DIAGNOSIS — J189 Pneumonia, unspecified organism: Secondary | ICD-10-CM

## 2019-11-10 DIAGNOSIS — E875 Hyperkalemia: Secondary | ICD-10-CM | POA: Diagnosis present

## 2019-11-10 DIAGNOSIS — N183 Chronic kidney disease, stage 3 unspecified: Secondary | ICD-10-CM | POA: Diagnosis present

## 2019-11-10 DIAGNOSIS — J4551 Severe persistent asthma with (acute) exacerbation: Secondary | ICD-10-CM

## 2019-11-10 DIAGNOSIS — R0602 Shortness of breath: Secondary | ICD-10-CM

## 2019-11-10 DIAGNOSIS — J4541 Moderate persistent asthma with (acute) exacerbation: Principal | ICD-10-CM | POA: Diagnosis present

## 2019-11-10 DIAGNOSIS — J9601 Acute respiratory failure with hypoxia: Secondary | ICD-10-CM | POA: Diagnosis present

## 2019-11-10 DIAGNOSIS — Z20822 Contact with and (suspected) exposure to covid-19: Secondary | ICD-10-CM | POA: Diagnosis present

## 2019-11-10 DIAGNOSIS — K5900 Constipation, unspecified: Secondary | ICD-10-CM | POA: Diagnosis not present

## 2019-11-10 DIAGNOSIS — Z79899 Other long term (current) drug therapy: Secondary | ICD-10-CM

## 2019-11-10 DIAGNOSIS — J45901 Unspecified asthma with (acute) exacerbation: Secondary | ICD-10-CM | POA: Diagnosis present

## 2019-11-10 DIAGNOSIS — Z8701 Personal history of pneumonia (recurrent): Secondary | ICD-10-CM

## 2019-11-10 LAB — SARS CORONAVIRUS 2 (TAT 6-24 HRS): SARS Coronavirus 2: NEGATIVE

## 2019-11-10 MED ORDER — SODIUM CHLORIDE 0.9 % IV SOLN: 250.0000 mL | INTRAVENOUS | Status: DC | PRN

## 2019-11-10 MED ORDER — PREDNISONE 20 MG PO TABS
60.0000 mg | ORAL_TABLET | Freq: Every day | ORAL | Status: DC
  Administered 2019-11-10: 60 mg via ORAL
  Filled 2019-11-10: qty 3

## 2019-11-10 MED ORDER — ACETAMINOPHEN 325 MG PO TABS: 650.0000 mg | ORAL_TABLET | Freq: Four times a day (QID) | ORAL | Status: DC | PRN

## 2019-11-10 MED ORDER — AEROCHAMBER PLUS FLO-VU MEDIUM MISC: 1.0000 | Freq: Once | Status: DC

## 2019-11-10 MED ORDER — FLUTICASONE PROPIONATE HFA 110 MCG/ACT IN AERO: 2.0000 | INHALATION_SPRAY | Freq: Two times a day (BID) | RESPIRATORY_TRACT | 1 refills | Status: DC

## 2019-11-10 MED ORDER — ALBUTEROL (5 MG/ML) CONTINUOUS INHALATION SOLN
10.0000 mg/h | INHALATION_SOLUTION | Freq: Once | RESPIRATORY_TRACT | Status: AC
  Administered 2019-11-10: 10 mg/h via RESPIRATORY_TRACT
  Filled 2019-11-10: qty 20

## 2019-11-10 MED ORDER — SODIUM CHLORIDE 0.9% FLUSH: 3.0000 mL | INTRAVENOUS | Status: DC | PRN

## 2019-11-10 MED ORDER — METHYLPREDNISOLONE SODIUM SUCC 125 MG IJ SOLR
125.0000 mg | Freq: Once | INTRAMUSCULAR | Status: AC
  Administered 2019-11-10: 125 mg via INTRAVENOUS
  Filled 2019-11-10: qty 2

## 2019-11-10 MED ORDER — ALBUTEROL SULFATE HFA 108 (90 BASE) MCG/ACT IN AERS
2.0000 | INHALATION_SPRAY | RESPIRATORY_TRACT | Status: AC
  Administered 2019-11-10 (×3): 2 via RESPIRATORY_TRACT

## 2019-11-10 MED ORDER — ALBUTEROL SULFATE HFA 108 (90 BASE) MCG/ACT IN AERS: 1.0000 | INHALATION_SPRAY | Freq: Four times a day (QID) | RESPIRATORY_TRACT | 1 refills | Status: DC | PRN

## 2019-11-10 MED ORDER — SODIUM CHLORIDE 0.9% FLUSH
3.0000 mL | Freq: Two times a day (BID) | INTRAVENOUS | Status: DC
  Administered 2019-11-10: 3 mL via INTRAVENOUS

## 2019-11-10 MED ORDER — ENOXAPARIN SODIUM 40 MG/0.4ML ~~LOC~~ SOLN: 40.0000 mg | SUBCUTANEOUS | Status: DC

## 2019-11-10 MED ORDER — IOHEXOL 300 MG/ML  SOLN
75.0000 mL | Freq: Once | INTRAMUSCULAR | Status: AC | PRN
  Administered 2019-11-10: 75 mL via INTRAVENOUS

## 2019-11-10 MED ORDER — SODIUM CHLORIDE (PF) 0.9 % IJ SOLN
INTRAMUSCULAR | Status: AC
  Filled 2019-11-10: qty 50

## 2019-11-10 MED ORDER — ACETAMINOPHEN 650 MG RE SUPP: 650.0000 mg | Freq: Four times a day (QID) | RECTAL | Status: DC | PRN

## 2019-11-10 MED ORDER — PREDNISONE 20 MG PO TABS: 40.0000 mg | ORAL_TABLET | Freq: Every day | ORAL | 0 refills | Status: DC

## 2019-11-10 NOTE — ED Notes (Signed)
Pt left against medical advise after being explained risks associated with hypoxia.

## 2019-11-10 NOTE — ED Notes (Signed)
Pt transported to CT ?

## 2019-11-10 NOTE — H&P (Signed)
History and Physical    Nicholas Ochoa ZOX:[id number] DOB: 1981-03-21 DOA: 11/09/2019  PCP: Anne Ng, NP (Confirm with patient/family/NH records and if not entered, this has to be entered at [facility] point of entry) Patient coming from: Home  I have personally briefly reviewed patient's old medical records in [facility]  Chief Complaint: SOB,wheezing  HPI: Nicholas Ochoa is a 39 y.o. male with medical history significant of asthma with multiple ED visits and admission for acute flares. Recently with CAP with asthma. He was seen in f/u by pulmonary 09/28/19 with recommendation for CT chest for further exacerbations over the short term and to continue with albuterol MDI, no recommendation for a LABA product.  Nicholas Ochoa had been doing ok until the last 24 hours when he became increasingly short of breath and increased wheezing. This was similar to previous acute asthma exacerbations. For his progressive symptoms he presents to  [facility]     ED Course: On presentation the patient was hypoxemic to 87% sat, with decreased air movement and extensive wheezing. He was administered Albuterol nebulizer treatments and was given solumedrol IV along with oxygen support. With persistent symptoms he was started on continuous albuterol inhalational therapy. [facility] was asked to admit the patient for continued treatment of his asthma exacerbation.  Review of Systems: As per HPI otherwise 10 point review of systems negative.    Past Medical History:  Diagnosis Date  . Asthma     Past Surgical History:  Procedure Laterality Date  . TONSILLECTOMY     Soc Hx - patient chooses to not share "personal" information.   reports that he has never smoked. He has never used smokeless tobacco. He reports previous alcohol use. He reports that he does not use drugs.  No Known Allergies  No family history on file. Patient did not wish to share family medical history information  Prior to Admission medications     Medication Sig Start Date End Date Taking? Authorizing Provider  albuterol (VENTOLIN HFA) 108 (90 Base) MCG/ACT inhaler INHALE 1 PUFF INTO THE LUNGS EVERY 6 (SIX) HOURS AS NEEDED FOR WHEEZING OR SHORTNESS OF BREATH. 10/25/19  Yes Nche, Bonna Gains, NP    Physical Exam: Vitals:   11/09/19 2330 11/10/19 0000 11/10/19 0010 11/10/19 0105  BP: (!) 149/97 (!) 133/92  (!) 160/92  Pulse: (!) 117 98  (!) 117  Resp: (!) 21 15  (!) 25  Temp:      TempSrc:      SpO2: 90% 90% 97% 96%  Weight:      Height:        Constitutional: NAD, calm, comfortable Vitals:   11/09/19 2330 11/10/19 0000 11/10/19 0010 11/10/19 0105  BP: (!) 149/97 (!) 133/92  (!) 160/92  Pulse: (!) 117 98  (!) 117  Resp: (!) 21 15  (!) 25  Temp:      TempSrc:      SpO2: 90% 90% 97% 96%  Weight:      Height:       General: - athletic appearing man on face mask nebulizer. Eyes: PERRL, lids and conjunctivae normal ENMT: Mucous membranes are moist. Posterior pharynx clear of any exudate or lesions.Normal dentition.  Neck: normal, supple, no masses, no thyromegaly Respiratory: Decrease air movement, prolonged expiratory phase with expiratory wheezing. No rales or rhonchi. Dry non-productive cough noted..  Cardiovascular: Regular tachycardia, no murmurs / rubs / gallops. No extremity edema. 2+ pedal pulses. No carotid bruits.  Abdomen: no tenderness, no masses  palpated. No hepatosplenomegaly. Bowel sounds positive.  Musculoskeletal: no clubbing / cyanosis. No joint deformity upper and lower extremities. Good ROM, no contractures. Normal muscle tone.  Skin: no rashes, lesions, ulcers. No induration Neurologic: CN 2-12 grossly intact. Sensation intact, DTR normal. Strength 5/5 in all 4.  Psychiatric: Normal judgment and insight. Alert and oriented x 3. Normal mood.   )  Labs on Admission: I have personally reviewed following labs and imaging studies  CBC: Recent Labs  Lab 11/09/19 2250  WBC 10.8*  NEUTROABS 5.1  HGB  14.1  HCT 42.2  MCV 86.7  PLT 237   Basic Metabolic Panel: Recent Labs  Lab 11/09/19 2250  NA 132*  K 4.1  CL 98  CO2 26  GLUCOSE 97  BUN 16  CREATININE 1.47*  CALCIUM 9.0   GFR: Estimated Creatinine Clearance: 67.9 mL/min (A) (by C-G formula based on SCr of 1.47 mg/dL (H)). Liver Function Tests: No results for input(s): AST, ALT, ALKPHOS, BILITOT, PROT, ALBUMIN in the last 168 hours. No results for input(s): LIPASE, AMYLASE in the last 168 hours. No results for input(s): AMMONIA in the last 168 hours. Coagulation Profile: No results for input(s): INR, PROTIME in the last 168 hours. Cardiac Enzymes: No results for input(s): CKTOTAL, CKMB, CKMBINDEX, TROPONINI in the last 168 hours. BNP (last 3 results) No results for input(s): PROBNP in the last 8760 hours. HbA1C: No results for input(s): HGBA1C in the last 72 hours. CBG: No results for input(s): GLUCAP in the last 168 hours. Lipid Profile: No results for input(s): CHOL, HDL, LDLCALC, TRIG, CHOLHDL, LDLDIRECT in the last 72 hours. Thyroid Function Tests: No results for input(s): TSH, T4TOTAL, FREET4, T3FREE, THYROIDAB in the last 72 hours. Anemia Panel: No results for input(s): VITAMINB12, FOLATE, FERRITIN, TIBC, IRON, RETICCTPCT in the last 72 hours. Urine analysis: No results found for: COLORURINE, APPEARANCEUR, LABSPEC, [place], GLUCOSEU, HGBUR, BILIRUBINUR, KETONESUR, PROTEINUR, UROBILINOGEN, NITRITE, LEUKOCYTESUR  Radiological Exams on Admission: DG Chest Port 1 View  Result Date: 11/09/2019 CLINICAL DATA:  Asthma attack EXAM: PORTABLE CHEST 1 VIEW COMPARISON:  09/21/2019 FINDINGS: The heart size and mediastinal contours are within normal limits. Both lungs are clear. The visualized skeletal structures are unremarkable. IMPRESSION: Negative. Electronically Signed   By: Rolm Baptise M.D.   On: 11/09/2019 22:51    EKG: Independently reviewed. Sinus tach  Assessment/Plan Active Problems:   Acute asthma  exacerbation   Acute exacerbation of extrinsic asthma  (please populate well all problems here in Problem List. (For example, if patient is on BP meds at home and you resume or decide to hold them, it is a problem that needs to be her. Same for CAD, COPD, HLD and so on)   1. Pulmonary - patient with recurrent acute asthma exacerbation with hypoxemia. Plan Step-down admit 2/2 respiratory distress and need for close monitoring  Albuterol MDI with aerochamber q 4 hrs  Prednisone 60 mg daily starting in AM  Oxygen to keep O2 sat >90%  Per pulmonary office note: CT chest  When stable consider initiating LABA  If patient has persistent symptoms - pulmonary consult  DVT prophylaxis: lovenox  Code Status: full code  Family Communication: patient declined to give permission to contact anyone Disposition Plan: home in 24-48 hrs  Consults called: none  Admission status: observation    Adella Hare MD [facility] Pager [phone]  If 7PM-7AM, please contact night-coverage [email or web address] Password [email or web address]  11/10/2019, 1:18 AM

## 2019-11-10 NOTE — ED Provider Notes (Signed)
[facility] EMERGENCY DEPARTMENT Provider Note   CSN: [id number] Arrival date & time: 11/10/19  2339     History Chief Complaint  Patient presents with  . Wheezing    Asthma  . Shortness of Breath    Nicholas Ochoa is a 39 y.o. male.  The history is provided by the patient and medical records.    39 year old male with history of asthma, presenting to the ED for shortness of breath.  He was seen at [facility] long ER last night and admitted due to persistent wheezing and hypoxia, but signed out AMA this afternoon.  States "I had some stuff to do" but breathing got worse about an hour prior to arrival.  EMS has given albuterol and Atrovent nebs along with 2 g magnesium with some mild improvement.  Patient has not had any fever.  He does report dry cough.  He had Covid test yesterday that was negative.  He has had prior admissions for asthma in the past but no prior intubations.  He is generally not oxygen dependent at home.  Past Medical History:  Diagnosis Date  . Asthma     Patient Active Problem List   Diagnosis Date Noted  . Acute asthma exacerbation 11/10/2019  . Acute exacerbation of extrinsic asthma 11/10/2019    Past Surgical History:  Procedure Laterality Date  . TONSILLECTOMY         No family history on file.  Social History   Tobacco Use  . Smoking status: Never Smoker  . Smokeless tobacco: Never Used  Substance Use Topics  . Alcohol use: Not Currently  . Drug use: Never    Home Medications Prior to Admission medications   Medication Sig Start Date End Date Taking? Authorizing Provider  albuterol (VENTOLIN HFA) 108 (90 Base) MCG/ACT inhaler Inhale 1 puff into the lungs every 6 (six) hours as needed for wheezing or shortness of breath. 11/10/19   Pokhrel, Corrie Mckusick, MD  fluticasone (FLOVENT HFA) 110 MCG/ACT inhaler Inhale 2 puffs into the lungs 2 (two) times daily. 11/10/19 11/09/20  Pokhrel, Corrie Mckusick, MD  predniSONE (DELTASONE) 20 MG tablet Take 2  tablets (40 mg total) by mouth daily with breakfast. 11/11/19   Pokhrel, Corrie Mckusick, MD    Allergies    Patient has no known allergies.  Review of Systems   Review of Systems  Respiratory: Positive for shortness of breath and wheezing.   All other systems reviewed and are negative.   Physical Exam Updated Vital Signs Ht 5\' 10"  (1.778 m)   Wt 80 kg   BMI 25.31 kg/m   Physical Exam Vitals and nursing note reviewed.  Constitutional:      Appearance: He is well-developed. He is diaphoretic.  HENT:     Head: Normocephalic and atraumatic.  Eyes:     Conjunctiva/sclera: Conjunctivae normal.     Pupils: Pupils are equal, round, and reactive to light.  Cardiovascular:     Rate and Rhythm: Normal rate and regular rhythm.     Heart sounds: Normal heart sounds.  Pulmonary:     Effort: Tachypnea, accessory muscle usage, respiratory distress and retractions present.     Breath sounds: Wheezing present.     Comments: Distressed, speaking in very short phrases, retracting, accessory muscle use, audible wheezing from bedside Abdominal:     General: Bowel sounds are normal.     Palpations: Abdomen is soft.  Musculoskeletal:        General: Normal range of motion.  Cervical back: Normal range of motion.  Skin:    General: Skin is warm.  Neurological:     Mental Status: He is alert and oriented to person, place, and time.     ED Results / Procedures / Treatments   Labs (all labs ordered are listed, but only abnormal results are displayed) Labs Reviewed  CBC WITH DIFFERENTIAL/PLATELET - Abnormal; Notable for the following components:      Result Value   WBC 11.8 (*)    Neutro Abs 9.8 (*)    All other components within normal limits  BASIC METABOLIC PANEL - Abnormal; Notable for the following components:   Potassium 5.2 (*)    Glucose, Bld 116 (*)    Creatinine, Ser 1.37 (*)    All other components within normal limits    EKG EKG Interpretation  Date/Time:  Wednesday November 10 2019 23:41:07 EDT Ventricular Rate:  122 PR Interval:    QRS Duration: 95 QT Interval:  301 QTC Calculation: 431 R Axis:   86 Text Interpretation: Sinus tachycardia Consider right atrial enlargement ST elev, probable normal early repol pattern similar to yesterday Confirmed by Marily Memos [phone]) on 11/10/2019 11:47:08 PM   Radiology  DG Chest Port 1 View  Result Date: 11/09/2019 CLINICAL DATA:  Asthma attack EXAM: PORTABLE CHEST 1 VIEW COMPARISON:  09/21/2019 FINDINGS: The heart size and mediastinal contours are within normal limits. Both lungs are clear. The visualized skeletal structures are unremarkable. IMPRESSION: Negative. Electronically Signed   By: Charlett Nose M.D.   On: 11/09/2019 22:51    Procedures Procedures (including critical care time)  CRITICAL CARE Performed by: Garlon Hatchet   Total critical care time:  45 minutes  Critical care time was exclusive of separately billable procedures and treating other patients.  Critical care was necessary to treat or prevent imminent or life-threatening deterioration.  Critical care was time spent personally by me on the following activities: development of treatment plan with patient and/or surrogate as well as nursing, discussions with consultants, evaluation of patient's response to treatment, examination of patient, obtaining history from patient or surrogate, ordering and performing treatments and interventions, ordering and review of laboratory studies, ordering and review of radiographic studies, pulse oximetry and re-evaluation of patient's condition.   Medications Ordered in ED Medications  methylPREDNISolone sodium succinate (SOLU-MEDROL) 125 mg/2 mL injection 125 mg (125 mg Intravenous Given 11/10/19 2353)  albuterol (PROVENTIL,VENTOLIN) solution continuous neb (0 mg/hr Nebulization Hold 11/11/19 0023)    ED Course  I have reviewed the triage vital signs and the nursing notes.  Pertinent labs & imaging results  that were available during my care of the patient were reviewed by me and considered in my medical decision making (see chart for details).    MDM Rules/Calculators/A&P  39 year old male presenting to the ED with shortness of breath.  Has significant history of asthma requiring admissions in the past.  He was actually admitted yesterday and signed out AMA because he "had to go do something".  He returns here in acute distress.  He is tachycardic, tachypneic, diaphoretic, using accessory muscles and retracting.  He is able to speak in short, truncated sentences.  He has had several nebs with EMS along with 2 g magnesium.  He was not given Solu-Medrol so we will give this now.  Will start on continuous neb.  Labs and chest x-ray pending.  He had formal Covid test yesterday that was negative so will not need to repeat.  12:25 AM  Called to patient's room as his HR jumped up to 140's on CAT.  He is feeling jittery and feels heart racing which is making him anxious.  He asked that we remove the CAT to give him a break.  He was transitioned back to 3L [place].  Will reassess.  CXR clear.  Labs reassuring.  He likely will need repeat admission.  1:12 AM Patient's HR down to 120's still feeling jittery and hesitant to continue with CAT but we will try to re-start soon, even if in short bursts.  He is now on 4L supplemental O2 and maintaining around 94%.  He is able to speak in short sentences, still retracting quite a bit but no longer diaphoretic.  He will require admission which I have openly discussed with him.   He states he understands it was a bad idea to leave yesterday and will stay for the duration of his recommended treatment.  Discussed with hospitalist, Dr. Toniann Fail who will admit for ongoing care.  Final Clinical Impression(s) / ED Diagnoses Final diagnoses:  Moderate persistent asthma with exacerbation    Rx / DC Orders ED Discharge Orders    None       Garlon Hatchet, PA-C 11/11/19  0125    Mesner, Barbara Cower, MD 11/11/19 [id number]

## 2019-11-10 NOTE — ED Triage Notes (Addendum)
Patient [place] with EMS from a local [place] with wheezing/SOB unrelieved by Albuterol/Atrovent inhalers , patient received Magnesium 2 grams IV , 1 Duoneb and 1 Albuterol nebulizer prior to arrival with mild relief . Patient seen at [facility] ER yesterday for the same complaints .

## 2019-11-10 NOTE — ED Notes (Signed)
Pt left off O2 and had Sp02 Sat trend down to mid 84, placed back on nasal cannula and 3 ltrs

## 2019-11-10 NOTE — Discharge Summary (Signed)
  Patient was admitted this morning.  Please see H&P for details.  Patient with history of asthma.  I saw the patient in the emergency department for follow-up.  He complained of shortness of breath and had hypoxia on supplemental oxygen.  Patient insisted on being discharged home.  I explained to him the risk of hypoxia and possible life-threatening situation.  Despite understanding the risk versus benefit, patient insisted on leaving AGAINST MEDICAL ADVICE.  I have however encouraged him to come back to the hospital if his clinical condition deteriorated or if he changed his mind.  I have prescribed prednisone, albuterol inhaler to his pharmacy and have informed him about it. At this time, patient has left AGAINST MEDICAL ADVICE.

## 2019-11-11 ENCOUNTER — Encounter (HOSPITAL_COMMUNITY): Payer: Self-pay | Admitting: Internal Medicine

## 2019-11-11 DIAGNOSIS — J45901 Unspecified asthma with (acute) exacerbation: Secondary | ICD-10-CM | POA: Diagnosis present

## 2019-11-11 DIAGNOSIS — J4541 Moderate persistent asthma with (acute) exacerbation: Principal | ICD-10-CM

## 2019-11-11 LAB — CBC WITH DIFFERENTIAL/PLATELET
Abs Immature Granulocytes: 0.03 10*3/uL (ref 0.00–0.07)
Abs Immature Granulocytes: 0.03 10*3/uL (ref 0.00–0.07)
Basophils Absolute: 0 10*3/uL (ref 0.0–0.1)
Basophils Absolute: 0 10*3/uL (ref 0.0–0.1)
Basophils Relative: 0 %
Basophils Relative: 0 %
Eosinophils Absolute: 0 10*3/uL (ref 0.0–0.5)
Eosinophils Absolute: 0 10*3/uL (ref 0.0–0.5)
Eosinophils Relative: 0 %
Eosinophils Relative: 0 %
HCT: 42 % (ref 39.0–52.0)
HCT: 45.7 % (ref 39.0–52.0)
Hemoglobin: 14.1 g/dL (ref 13.0–17.0)
Hemoglobin: 15.2 g/dL (ref 13.0–17.0)
Immature Granulocytes: 0 %
Immature Granulocytes: 0 %
Lymphocytes Relative: 11 %
Lymphocytes Relative: 5 %
Lymphs Abs: 0.4 10*3/uL — ABNORMAL LOW (ref 0.7–4.0)
Lymphs Abs: 1.3 10*3/uL (ref 0.7–4.0)
MCH: 28.6 pg (ref 26.0–34.0)
MCH: 28.7 pg (ref 26.0–34.0)
MCHC: 33.3 g/dL (ref 30.0–36.0)
MCHC: 33.6 g/dL (ref 30.0–36.0)
MCV: 85.4 fL (ref 80.0–100.0)
MCV: 85.9 fL (ref 80.0–100.0)
Monocytes Absolute: 0.1 10*3/uL (ref 0.1–1.0)
Monocytes Absolute: 0.7 10*3/uL (ref 0.1–1.0)
Monocytes Relative: 1 %
Monocytes Relative: 6 %
Neutro Abs: 7.5 10*3/uL (ref 1.7–7.7)
Neutro Abs: 9.8 10*3/uL — ABNORMAL HIGH (ref 1.7–7.7)
Neutrophils Relative %: 83 %
Neutrophils Relative %: 94 %
Platelets: 255 10*3/uL (ref 150–400)
Platelets: 286 10*3/uL (ref 150–400)
RBC: 4.92 MIL/uL (ref 4.22–5.81)
RBC: 5.32 MIL/uL (ref 4.22–5.81)
RDW: 14 % (ref 11.5–15.5)
RDW: 14 % (ref 11.5–15.5)
WBC: 11.8 10*3/uL — ABNORMAL HIGH (ref 4.0–10.5)
WBC: 8 10*3/uL (ref 4.0–10.5)
nRBC: 0 % (ref 0.0–0.2)
nRBC: 0 % (ref 0.0–0.2)

## 2019-11-11 LAB — URINALYSIS, ROUTINE W REFLEX MICROSCOPIC
Bilirubin Urine: NEGATIVE
Glucose, UA: NEGATIVE mg/dL
Hgb urine dipstick: NEGATIVE
Ketones, ur: NEGATIVE mg/dL
Leukocytes,Ua: NEGATIVE
Nitrite: NEGATIVE
Protein, ur: NEGATIVE mg/dL
Specific Gravity, Urine: 1.012 (ref 1.005–1.030)
pH: 6 (ref 5.0–8.0)

## 2019-11-11 LAB — COMPREHENSIVE METABOLIC PANEL
ALT: 23 U/L (ref 0–44)
AST: 26 U/L (ref 15–41)
Albumin: 3.9 g/dL (ref 3.5–5.0)
Alkaline Phosphatase: 76 U/L (ref 38–126)
Anion gap: 10 (ref 5–15)
BUN: 13 mg/dL (ref 6–20)
CO2: 27 mmol/L (ref 22–32)
Calcium: 9.5 mg/dL (ref 8.9–10.3)
Chloride: 100 mmol/L (ref 98–111)
Creatinine, Ser: 1.35 mg/dL — ABNORMAL HIGH (ref 0.61–1.24)
GFR calc Af Amer: >60 mL/min (ref >60)
GFR calc non Af Amer: >60 mL/min (ref >60)
Glucose, Bld: 150 mg/dL — ABNORMAL HIGH (ref 70–99)
Potassium: 4.8 mmol/L (ref 3.5–5.1)
Sodium: 137 mmol/L (ref 135–145)
Total Bilirubin: 0.4 mg/dL (ref 0.3–1.2)
Total Protein: 7.7 g/dL (ref 6.5–8.1)

## 2019-11-11 LAB — CBC
HCT: 40.9 % (ref 39.0–52.0)
Hemoglobin: 13.7 g/dL (ref 13.0–17.0)
MCH: 28.9 pg (ref 26.0–34.0)
MCHC: 33.5 g/dL (ref 30.0–36.0)
MCV: 86.3 fL (ref 80.0–100.0)
Platelets: 269 10*3/uL (ref 150–400)
RBC: 4.74 MIL/uL (ref 4.22–5.81)
RDW: 13.9 % (ref 11.5–15.5)
WBC: 12.9 10*3/uL — ABNORMAL HIGH (ref 4.0–10.5)
nRBC: 0 % (ref 0.0–0.2)

## 2019-11-11 LAB — BASIC METABOLIC PANEL
Anion gap: 15 (ref 5–15)
BUN: 14 mg/dL (ref 6–20)
CO2: 22 mmol/L (ref 22–32)
Calcium: 9.7 mg/dL (ref 8.9–10.3)
Chloride: 101 mmol/L (ref 98–111)
Creatinine, Ser: 1.37 mg/dL — ABNORMAL HIGH (ref 0.61–1.24)
GFR calc Af Amer: >60 mL/min (ref >60)
GFR calc non Af Amer: >60 mL/min (ref >60)
Glucose, Bld: 116 mg/dL — ABNORMAL HIGH (ref 70–99)
Potassium: 5.2 mmol/L — ABNORMAL HIGH (ref 3.5–5.1)
Sodium: 138 mmol/L (ref 135–145)

## 2019-11-11 LAB — CREATININE, SERUM
Creatinine, Ser: 1.37 mg/dL — ABNORMAL HIGH (ref 0.61–1.24)
GFR calc Af Amer: >60 mL/min (ref >60)
GFR calc non Af Amer: >60 mL/min (ref >60)

## 2019-11-11 MED ORDER — ALBUTEROL SULFATE (2.5 MG/3ML) 0.083% IN NEBU: 2.5000 mg | INHALATION_SOLUTION | RESPIRATORY_TRACT | Status: DC | PRN

## 2019-11-11 MED ORDER — METHYLPREDNISOLONE SODIUM SUCC 40 MG IJ SOLR
40.0000 mg | Freq: Two times a day (BID) | INTRAMUSCULAR | Status: DC
  Administered 2019-11-11 – 2019-11-13 (×5): 40 mg via INTRAVENOUS
  Filled 2019-11-11 (×5): qty 1

## 2019-11-11 MED ORDER — BUDESONIDE 0.25 MG/2ML IN SUSP
0.2500 mg | Freq: Two times a day (BID) | RESPIRATORY_TRACT | Status: DC
  Administered 2019-11-11 – 2019-11-14 (×7): 0.25 mg via RESPIRATORY_TRACT
  Filled 2019-11-11 (×9): qty 2

## 2019-11-11 MED ORDER — AZITHROMYCIN 250 MG PO TABS
500.0000 mg | ORAL_TABLET | Freq: Every day | ORAL | Status: DC
  Administered 2019-11-11 – 2019-11-14 (×4): 500 mg via ORAL
  Filled 2019-11-11 (×4): qty 2

## 2019-11-11 MED ORDER — ONDANSETRON HCL 4 MG PO TABS: 4.0000 mg | ORAL_TABLET | Freq: Four times a day (QID) | ORAL | Status: DC | PRN

## 2019-11-11 MED ORDER — ONDANSETRON HCL 4 MG/2ML IJ SOLN: 4.0000 mg | Freq: Four times a day (QID) | INTRAMUSCULAR | Status: DC | PRN

## 2019-11-11 MED ORDER — ALBUTEROL SULFATE (2.5 MG/3ML) 0.083% IN NEBU
2.5000 mg | INHALATION_SOLUTION | RESPIRATORY_TRACT | Status: DC
  Administered 2019-11-11 – 2019-11-12 (×10): 2.5 mg via RESPIRATORY_TRACT
  Filled 2019-11-11 (×10): qty 3

## 2019-11-11 MED ORDER — ENOXAPARIN SODIUM 40 MG/0.4ML ~~LOC~~ SOLN
40.0000 mg | SUBCUTANEOUS | Status: DC
  Administered 2019-11-12 – 2019-11-13 (×2): 40 mg via SUBCUTANEOUS
  Filled 2019-11-11 (×3): qty 0.4

## 2019-11-11 MED ORDER — SODIUM CHLORIDE 0.9 % IV SOLN
1.0000 g | INTRAVENOUS | Status: DC
  Administered 2019-11-11 – 2019-11-13 (×3): 1 g via INTRAVENOUS
  Filled 2019-11-11: qty 1
  Filled 2019-11-11 (×2): qty 10

## 2019-11-11 NOTE — ED Notes (Signed)
Updated pt's sister, Franne Forts, about pt's plan of care, with patient's approval. All questions answered

## 2019-11-11 NOTE — Telephone Encounter (Signed)
Dr. Val Eagle, please see pt's mychart messages where he is asking if a bronchoscopy could be scheduled due to still having problems with infection. Please advise on this for pt.

## 2019-11-11 NOTE — H&P (Signed)
History and Physical    Drayke Grabel QPR:[id number] DOB: 1981/01/29 DOA: 11/10/2019  PCP: Anne Ng, NP  Patient coming from: Home.  Chief Complaint: Shortness of breath.  HPI: Nicholas Ochoa is a 39 y.o. male with history of bronchial asthma poorly controlled had been treated for pneumonia in February 2021 was admitted yesterday for asthma exacerbation patient left AGAINST MEDICAL ADVICE since patient had work to do at home presents back last evening with complaints of worsening shortness of breath.  Some productive cough denies any chest pain no fever chills.  Covid test at that time was negative.  ED Course: In the ER patient was afebrile requiring 4 L oxygen able to talk but is diffusely wheezing.  Chest x-ray unremarkable.  Labs show potassium of 5.2 creatinine 1.3 WBC count of 11.8.  Despite giving continuous nebulizer patient still wheezing and admitted for further management of asthma exacerbation.  Review of Systems: As per HPI, rest all negative.   Past Medical History:  Diagnosis Date  . Asthma     Past Surgical History:  Procedure Laterality Date  . TONSILLECTOMY       reports that he has never smoked. He has never used smokeless tobacco. He reports previous alcohol use. He reports that he does not use drugs.  No Known Allergies  Family History  Family history unknown: Yes    Prior to Admission medications   Medication Sig Start Date End Date Taking? Authorizing Provider  albuterol (VENTOLIN HFA) 108 (90 Base) MCG/ACT inhaler Inhale 1 puff into the lungs every 6 (six) hours as needed for wheezing or shortness of breath. 11/10/19   Pokhrel, Rebekah Chesterfield, MD  fluticasone (FLOVENT HFA) 110 MCG/ACT inhaler Inhale 2 puffs into the lungs 2 (two) times daily. 11/10/19 11/09/20  Pokhrel, Rebekah Chesterfield, MD  predniSONE (DELTASONE) 20 MG tablet Take 2 tablets (40 mg total) by mouth daily with breakfast. 11/11/19   Pokhrel, Rebekah Chesterfield, MD    Physical Exam: Constitutional: Moderately built and  nourished. Vitals:   11/11/19 0045 11/11/19 0053 11/11/19 0115 11/11/19 0200  BP: 133/90  (!) 144/76 122/69  Pulse:  (!) 135 (!) 125 (!) 118  Resp:  (!) 30 20 15   Temp:      TempSrc:      SpO2:  93% 94% 97%  Weight:      Height:       Eyes: Anicteric no pallor. ENMT: No discharge from the ears eyes nose or mouth. Neck: No mass or.  No neck rigidity. Respiratory: Bilateral expiratory wheeze and no crepitations. Cardiovascular: S1-S2 heard. Abdomen: Soft nontender bowel sounds present. Musculoskeletal: No edema. Skin: No rash. Neurologic: Alert awake oriented to time place and person.  Moves all extremities. Psychiatric: Appears normal per normal affect.   Labs on Admission: I have personally reviewed following labs and imaging studies  CBC: Recent Labs  Lab 11/09/19 2250 11/10/19 2347  WBC 10.8* 11.8*  NEUTROABS 5.1 9.8*  HGB 14.1 15.2  HCT 42.2 45.7  MCV 86.7 85.9  PLT 257 286   Basic Metabolic Panel: Recent Labs  Lab 11/09/19 2250 11/10/19 2347  NA 132* 138  K 4.1 5.2*  CL 98 101  CO2 26 22  GLUCOSE 97 116*  BUN 16 14  CREATININE 1.47* 1.37*  CALCIUM 9.0 9.7   GFR: Estimated Creatinine Clearance: 73.3 mL/min (A) (by C-G formula based on SCr of 1.37 mg/dL (H)). Liver Function Tests: No results for input(s): AST, ALT, ALKPHOS, BILITOT, PROT, ALBUMIN in the last 168  hours. No results for input(s): LIPASE, AMYLASE in the last 168 hours. No results for input(s): AMMONIA in the last 168 hours. Coagulation Profile: No results for input(s): INR, PROTIME in the last 168 hours. Cardiac Enzymes: No results for input(s): CKTOTAL, CKMB, CKMBINDEX, TROPONINI in the last 168 hours. BNP (last 3 results) No results for input(s): PROBNP in the last 8760 hours. HbA1C: No results for input(s): HGBA1C in the last 72 hours. CBG: No results for input(s): GLUCAP in the last 168 hours. Lipid Profile: No results for input(s): CHOL, HDL, LDLCALC, TRIG, CHOLHDL, LDLDIRECT in  the last 72 hours. Thyroid Function Tests: No results for input(s): TSH, T4TOTAL, FREET4, T3FREE, THYROIDAB in the last 72 hours. Anemia Panel: No results for input(s): VITAMINB12, FOLATE, FERRITIN, TIBC, IRON, RETICCTPCT in the last 72 hours. Urine analysis: No results found for: COLORURINE, APPEARANCEUR, LABSPEC, PHURINE, GLUCOSEU, HGBUR, BILIRUBINUR, KETONESUR, PROTEINUR, UROBILINOGEN, NITRITE, LEUKOCYTESUR Sepsis Labs: @LABRCNTIP (procalcitonin:4,lacticidven:4) ) Recent Results (from the past 240 hour(s))  SARS CORONAVIRUS 2 (TAT 6-24 HRS) Nasopharyngeal Nasopharyngeal Swab     Status: None   Collection Time: 11/09/19 11:44 PM   Specimen: Nasopharyngeal Swab  Result Value Ref Range Status   SARS Coronavirus 2 NEGATIVE NEGATIVE Final    Comment: (NOTE) SARS-CoV-2 target nucleic acids are NOT DETECTED. The SARS-CoV-2 RNA is generally detectable in upper and lower respiratory specimens during the acute phase of infection. Negative results do not preclude SARS-CoV-2 infection, do not rule out co-infections with other pathogens, and should not be used as the sole basis for treatment or other patient management decisions. Negative results must be combined with clinical observations, patient history, and epidemiological information. The expected result is Negative. Fact Sheet for Patients: SugarRoll.be Fact Sheet for Healthcare Providers: https://www.woods-mathews.com/ This test is not yet approved or cleared by the [place] FDA and  has been authorized for detection and/or diagnosis of SARS-CoV-2 by FDA under an Emergency Use Authorization (EUA). This EUA will remain  in effect (meaning this test can be used) for the duration of the COVID-19 declaration under Section 56 4(b)(1) of the Act, 21 U.S.C. section 360bbb-3(b)(1), unless the authorization is terminated or revoked sooner. Performed at [facility], [address]  [place], [place] [address]      Radiological Exams on Admission: CT CHEST W CONTRAST  Result Date: 11/10/2019 CLINICAL DATA:  Frequent asthma exacerbation EXAM: CT CHEST WITH CONTRAST TECHNIQUE: Multidetector CT imaging of the chest was performed during intravenous contrast administration. CONTRAST:  37mL OMNIPAQUE IOHEXOL 300 MG/ML  SOLN COMPARISON:  05/31/2019 FINDINGS: Cardiovascular: Normal heart size.  Thoracic aorta is unremarkable. Mediastinum/Nodes: No new adenopathy. Included thyroid is unremarkable. Lungs/Pleura: No consolidation or mass. Areas of mild tree-in-bud nodularity in the prior study have improved. However, there are new patchy areas of interstitial thickening and nodularity primarily in the right lower lobe (series 3, image 83 and image 91). Mild peribronchial thickening. Multifocal mucous plugging again noted. No pleural effusion or pneumothorax. Upper Abdomen: No acute abnormality. Musculoskeletal: No chest wall abnormality. No acute or significant osseous findings. IMPRESSION: Persistent multifocal mucous plugging and peribronchial thickening. New patchy small areas of interstitial thickening and nodularity primarily in the right lower lobe. Given history, consider ABPA. Electronically Signed   By: Macy Mis M.D.   On: 11/10/2019 07:40   DG Chest Port 1 View  Result Date: 11/10/2019 CLINICAL DATA:  Shortness of breath, asthma EXAM: PORTABLE CHEST 1 VIEW COMPARISON:  11/09/2019 FINDINGS: Heart and mediastinal contours are within normal limits.  No focal opacities or effusions. No acute bony abnormality. IMPRESSION: No active disease. Electronically Signed   By: Charlett Nose M.D.   On: 11/10/2019 23:54   DG Chest Port 1 View  Result Date: 11/09/2019 CLINICAL DATA:  Asthma attack EXAM: PORTABLE CHEST 1 VIEW COMPARISON:  09/21/2019 FINDINGS: The heart size and mediastinal contours are within normal limits. Both lungs are clear. The visualized skeletal structures are unremarkable.  IMPRESSION: Negative. Electronically Signed   By: Charlett Nose M.D.   On: 11/09/2019 22:51    EKG: Independently reviewed.  Sinus tachycardia with early repolarization changes.  Assessment/Plan Principal Problem:   Asthma exacerbation    1. Acute respiratory failure hypoxia secondary to asthma exacerbation for which patient is placed on IV Solu-Medrol nebulizer therapy Pulmicort.  Closely monitor.  At this time patient is able to complete sentences without difficulty but still short of breath. 2. Acute renal failure creatinine appears to be increased from baseline in February.  Check UA follow metabolic panel. 3. Mild hyperkalemia repeat metabolic panel to make sure it is not worsening.   DVT prophylaxis: Lovenox. Code Status: Full code. Family Communication: Discussed with patient. Disposition Plan: Home. Consults called: None. Admission status: Observation.   Eduard Clos MD [facility] Pager [phone].  If 7PM-7AM, please contact night-coverage [email or web address] Password [email or web address]  11/11/2019, 2:08 AM

## 2019-11-11 NOTE — ED Notes (Signed)
SDU  Breakfast ordered  

## 2019-11-11 NOTE — ED Notes (Signed)
Patient resting on stretcher offered to straighten sheets on stretcher patient refused states he is comfortable. Crackers and water given to patient at his request. States he is feeling better.

## 2019-11-11 NOTE — ED Notes (Signed)
Continuous albuterol treatment stopped after pt said he felt jittery and like his HR was too high. HR noted to be 145-150. Provider notified and provider stopped treatment and placed pt back on 3L [place]

## 2019-11-11 NOTE — ED Notes (Signed)
Nicholas Ochoa, sister, [phone] would like an update when available

## 2019-11-11 NOTE — Progress Notes (Signed)
PROGRESS NOTE    Nicholas Ochoa  GYI:[id number] DOB: May 20, 1981 DOA: 11/10/2019 PCP: Anne Ng, NP    Brief Narrative:  HPI: Nicholas Ochoa is a 39 y.o. male with history of bronchial asthma poorly controlled had been treated for pneumonia in February 2021, not on maintenance steroid inhaler at home , admitted 1 day ago at [facility] for asthma exacerbation, left AGAINST MEDICAL ADVICE, came back to emergency room with worsening shortness of breath.  Has productive cough with mucoid sputum.  COVID-19 was negative.  Gets frequent asthma exacerbations.    ED Course: In the ER patient was afebrile requiring 4 L oxygen able to talk but is diffusely wheezing.  Chest x-ray unremarkable.  Labs show potassium of 5.2 creatinine 1.3 WBC count of 11.8.  Despite giving continuous nebulizer patient still wheezing and admitted for further management of asthma exacerbation. CT scan of the chest on 4/7 with evidence of mucoid debris on his bronchus.   Assessment & Plan:   Principal Problem:   Asthma exacerbation  Acute asthma exacerbation with history of moderate persistent asthma, acute hypoxic respiratory failure: Patient has significant symptoms and is still on 4 L oxygen. Aggressive bronchodilator therapy, IV steroids, inhalational steroids, scheduled and as needed bronchodilators, deep breathing exercises, incentive spirometry, chest physiotherapy. Excessive sputum production, will add antibiotics with Rocephin and azithromycin. Supplemental oxygen to keep saturations more than 90%. Mobility. Patient will also need help with medications especially steroid inhalers.  Acute renal failure: Remains stable.  We will continue to monitor.   DVT prophylaxis: Lovenox subcu Code Status: Full code Family Communication: Discussed with patient Disposition Plan: patient is from home. Anticipated DC to home, Barriers to discharge with significant shortness of breath and hypoxemia, he will continue to need  treatment in the hospital, IV medications, respiratory therapy care.   Consultants:   None  Procedures:   None  Antimicrobials:  Anti-infectives (From admission, onward)   Start     Dose/Rate Route Frequency Ordered Stop   11/11/19 1000  azithromycin (ZITHROMAX) tablet 500 mg     500 mg Oral Daily 11/11/19 0737 11/16/19 0959   11/11/19 0800  cefTRIAXone (ROCEPHIN) 1 g in sodium chloride 0.9 % 100 mL IVPB     1 g 200 mL/hr over 30 Minutes Intravenous Every 24 hours 11/11/19 0737           Subjective: Patient seen and examined.  Waiting for bed availability in the emergency room.  Complained of palpitation after use of high doses of albuterol.  Plenty of mucus production.  No other overnight events. He is agreeable to stay until he feels better.  Objective: Vitals:   11/11/19 1000 11/11/19 1044 11/11/19 1045 11/11/19 1100  BP: 138/79     Pulse: 98 87 88 86  Resp: 13 18 16 15   Temp:      TempSrc:      SpO2: 97% 97% 97% 98%  Weight:      Height:        Intake/Output Summary (Last 24 hours) at 11/11/2019 1116 Last data filed at 11/11/2019 0533 Gross per 24 hour  Intake --  Output 700 ml  Net -700 ml   Filed Weights   11/10/19 2340  Weight: 80 kg    Examination:  General exam: Appears calm.  On mild respiratory distress and anxious. Respiratory system: Inspiratory and expiratory wheezes and conducted upper airway sounds. Cardiovascular system: S1 & S2 heard, RRR.  Tachycardic.  No JVD, murmurs, rubs, gallops or  clicks. No pedal edema. Gastrointestinal system: Abdomen is nondistended, soft and nontender. No organomegaly or masses felt. Normal bowel sounds heard. Central nervous system: Alert and oriented. No focal neurological deficits. Extremities: Symmetric 5 x 5 power. Skin: No rashes, lesions or ulcers Psychiatry: Judgement and insight appear normal. Mood & affect appropriate.     Data Reviewed: I have personally reviewed following labs and imaging  studies  CBC: Recent Labs  Lab 11/09/19 2250 11/10/19 2347 11/11/19 0207 11/11/19 0604  WBC 10.8* 11.8* 12.9* 8.0  NEUTROABS 5.1 9.8*  --  7.5  HGB 14.1 15.2 13.7 14.1  HCT 42.2 45.7 40.9 42.0  MCV 86.7 85.9 86.3 85.4  PLT 257 286 269 387   Basic Metabolic Panel: Recent Labs  Lab 11/09/19 2250 11/10/19 2347 11/11/19 0207 11/11/19 0604  NA 132* 138  --  137  K 4.1 5.2*  --  4.8  CL 98 101  --  100  CO2 26 22  --  27  GLUCOSE 97 116*  --  150*  BUN 16 14  --  13  CREATININE 1.47* 1.37* 1.37* 1.35*  CALCIUM 9.0 9.7  --  9.5   GFR: Estimated Creatinine Clearance: 74.4 mL/min (A) (by C-G formula based on SCr of 1.35 mg/dL (H)). Liver Function Tests: Recent Labs  Lab 11/11/19 0604  AST 26  ALT 23  ALKPHOS 76  BILITOT 0.4  PROT 7.7  ALBUMIN 3.9   No results for input(s): LIPASE, AMYLASE in the last 168 hours. No results for input(s): AMMONIA in the last 168 hours. Coagulation Profile: No results for input(s): INR, PROTIME in the last 168 hours. Cardiac Enzymes: No results for input(s): CKTOTAL, CKMB, CKMBINDEX, TROPONINI in the last 168 hours. BNP (last 3 results) No results for input(s): PROBNP in the last 8760 hours. HbA1C: No results for input(s): HGBA1C in the last 72 hours. CBG: No results for input(s): GLUCAP in the last 168 hours. Lipid Profile: No results for input(s): CHOL, HDL, LDLCALC, TRIG, CHOLHDL, LDLDIRECT in the last 72 hours. Thyroid Function Tests: No results for input(s): TSH, T4TOTAL, FREET4, T3FREE, THYROIDAB in the last 72 hours. Anemia Panel: No results for input(s): VITAMINB12, FOLATE, FERRITIN, TIBC, IRON, RETICCTPCT in the last 72 hours. Sepsis Labs: No results for input(s): PROCALCITON, LATICACIDVEN in the last 168 hours.  Recent Results (from the past 240 hour(s))  SARS CORONAVIRUS 2 (TAT 6-24 HRS) Nasopharyngeal Nasopharyngeal Swab     Status: None   Collection Time: 11/09/19 11:44 PM   Specimen: Nasopharyngeal Swab  Result  Value Ref Range Status   SARS Coronavirus 2 NEGATIVE NEGATIVE Final    Comment: (NOTE) SARS-CoV-2 target nucleic acids are NOT DETECTED. The SARS-CoV-2 RNA is generally detectable in upper and lower respiratory specimens during the acute phase of infection. Negative results do not preclude SARS-CoV-2 infection, do not rule out co-infections with other pathogens, and should not be used as the sole basis for treatment or other patient management decisions. Negative results must be combined with clinical observations, patient history, and epidemiological information. The expected result is Negative. Fact Sheet for Patients: SugarRoll.be Fact Sheet for Healthcare Providers: https://www.woods-mathews.com/ This test is not yet approved or cleared by the [place] FDA and  has been authorized for detection and/or diagnosis of SARS-CoV-2 by FDA under an Emergency Use Authorization (EUA). This EUA will remain  in effect (meaning this test can be used) for the duration of the COVID-19 declaration under Section 56 4(b)(1) of the Act, 21 U.S.C. section  360bbb-3(b)(1), unless the authorization is terminated or revoked sooner. Performed at [facility], [address] [place], [place] [address]          Radiology Studies: CT CHEST W CONTRAST  Result Date: 11/10/2019 CLINICAL DATA:  Frequent asthma exacerbation EXAM: CT CHEST WITH CONTRAST TECHNIQUE: Multidetector CT imaging of the chest was performed during intravenous contrast administration. CONTRAST:  71mL OMNIPAQUE IOHEXOL 300 MG/ML  SOLN COMPARISON:  05/31/2019 FINDINGS: Cardiovascular: Normal heart size.  Thoracic aorta is unremarkable. Mediastinum/Nodes: No new adenopathy. Included thyroid is unremarkable. Lungs/Pleura: No consolidation or mass. Areas of mild tree-in-bud nodularity in the prior study have improved. However, there are new patchy areas of interstitial thickening and  nodularity primarily in the right lower lobe (series 3, image 83 and image 91). Mild peribronchial thickening. Multifocal mucous plugging again noted. No pleural effusion or pneumothorax. Upper Abdomen: No acute abnormality. Musculoskeletal: No chest wall abnormality. No acute or significant osseous findings. IMPRESSION: Persistent multifocal mucous plugging and peribronchial thickening. New patchy small areas of interstitial thickening and nodularity primarily in the right lower lobe. Given history, consider ABPA. Electronically Signed   By: Guadlupe Spanish M.D.   On: 11/10/2019 07:40   DG Chest Port 1 View  Result Date: 11/10/2019 CLINICAL DATA:  Shortness of breath, asthma EXAM: PORTABLE CHEST 1 VIEW COMPARISON:  11/09/2019 FINDINGS: Heart and mediastinal contours are within normal limits. No focal opacities or effusions. No acute bony abnormality. IMPRESSION: No active disease. Electronically Signed   By: Charlett Nose M.D.   On: 11/10/2019 23:54   DG Chest Port 1 View  Result Date: 11/09/2019 CLINICAL DATA:  Asthma attack EXAM: PORTABLE CHEST 1 VIEW COMPARISON:  09/21/2019 FINDINGS: The heart size and mediastinal contours are within normal limits. Both lungs are clear. The visualized skeletal structures are unremarkable. IMPRESSION: Negative. Electronically Signed   By: Charlett Nose M.D.   On: 11/09/2019 22:51        Scheduled Meds: . albuterol  2.5 mg Nebulization Q4H  . azithromycin  500 mg Oral Daily  . budesonide (PULMICORT) nebulizer solution  0.25 mg Nebulization BID  . enoxaparin (LOVENOX) injection  40 mg Subcutaneous Q24H  . methylPREDNISolone (SOLU-MEDROL) injection  40 mg Intravenous Q12H   Continuous Infusions: . cefTRIAXone (ROCEPHIN)  IV Stopped (11/11/19 0927)     LOS: 0 days    Time spent: 25 minutes    Dorcas Carrow, MD [facility] Pager [phone]

## 2019-11-11 NOTE — ED Notes (Signed)
Lunch ordered 

## 2019-11-12 MED ORDER — IPRATROPIUM-ALBUTEROL 0.5-2.5 (3) MG/3ML IN SOLN
3.0000 mL | RESPIRATORY_TRACT | Status: DC
  Administered 2019-11-12 – 2019-11-14 (×10): 3 mL via RESPIRATORY_TRACT
  Filled 2019-11-12 (×6): qty 3
  Filled 2019-11-12: qty 30
  Filled 2019-11-12 (×4): qty 3

## 2019-11-12 MED ORDER — POLYETHYLENE GLYCOL 3350 17 G PO PACK
17.0000 g | PACK | Freq: Every day | ORAL | Status: DC
  Administered 2019-11-12 – 2019-11-14 (×3): 17 g via ORAL
  Filled 2019-11-12 (×3): qty 1

## 2019-11-12 NOTE — Progress Notes (Signed)
PROGRESS NOTE    Nicholas Ochoa  OEU:[id number] DOB: Dec 05, 1980 DOA: 11/10/2019 PCP: Anne Ng, NP    Brief Narrative:  HPI: Nicholas Ochoa is a 39 y.o. male with history of bronchial asthma poorly controlled had been treated for pneumonia in February 2021, not on maintenance steroid inhaler at home , admitted 1 day ago at [facility] for asthma exacerbation, left AGAINST MEDICAL ADVICE, came back to emergency room with worsening shortness of breath.  Has productive cough with mucoid sputum.  COVID-19 was negative.  Gets frequent asthma exacerbations.    ED Course: In the ER patient was afebrile requiring 4 L oxygen able to talk but is diffusely wheezing.  Chest x-ray unremarkable.  Labs show potassium of 5.2 creatinine 1.3 WBC count of 11.8.  Despite giving continuous nebulizer patient still wheezing and admitted for further management of asthma exacerbation. CT scan of the chest on 4/7 with evidence of mucoid debris on his bronchus.   Assessment & Plan:   Principal Problem:   Asthma exacerbation Active Problems:   Acute asthma exacerbation  Acute asthma exacerbation with history of moderate persistent asthma, acute hypoxic respiratory failure: Patient has significant symptoms and admitted for treatment. Continue aggressive bronchodilator therapy, IV steroids, inhalational steroids, scheduled and as needed bronchodilators, deep breathing exercises, incentive spirometry, chest physiotherapy. Excessive sputum production, added antibiotics with Rocephin and azithromycin. Supplemental oxygen to keep saturations more than 90%. Mobility. Patient will also need help with medications especially steroid inhalers.  Acute renal failure: Remains stable.  We will continue to monitor.  Unsure about baseline creatinine.  We will recheck tomorrow morning.   DVT prophylaxis: Lovenox subcu Code Status: Full code Family Communication: Discussed with patient Disposition Plan: patient is from home.  Anticipated DC to home, Barriers to discharge with significant shortness of breath and hypoxemia, he will continue to need treatment in the hospital, IV medications, respiratory therapy care.   Consultants:   None  Procedures:   None  Antimicrobials:  Anti-infectives (From admission, onward)   Start     Dose/Rate Route Frequency Ordered Stop   11/11/19 1000  azithromycin (ZITHROMAX) tablet 500 mg     500 mg Oral Daily 11/11/19 0737 11/16/19 0959   11/11/19 0800  cefTRIAXone (ROCEPHIN) 1 g in sodium chloride 0.9 % 100 mL IVPB     1 g 200 mL/hr over 30 Minutes Intravenous Every 24 hours 11/11/19 0737           Subjective: Patient seen and examined.  Still wheezing and short of breath, however better than yesterday.  Still on 1 to 2 L of oxygen.  Complains of constipation.  Objective: Vitals:   11/12/19 0916 11/12/19 0919 11/12/19 1000 11/12/19 1206  BP:      Pulse:   100   Resp:      Temp:      TempSrc:      SpO2: 96% 94%  98%  Weight:      Height:        Intake/Output Summary (Last 24 hours) at 11/12/2019 1255 Last data filed at 11/12/2019 1038 Gross per 24 hour  Intake 100 ml  Output 400 ml  Net -300 ml   Filed Weights   11/10/19 2340  Weight: 80 kg    Examination:  General exam: Appears calm.  On mild respiratory distress.  Looks comfortable. Respiratory system: Inspiratory and expiratory wheezes and conducted upper airway sounds. Cardiovascular system: S1 & S2 heard, RRR.  Tachycardic.  No JVD, murmurs, rubs,  gallops or clicks. No pedal edema. Gastrointestinal system: Abdomen is nondistended, soft and nontender. No organomegaly or masses felt. Normal bowel sounds heard. Central nervous system: Alert and oriented. No focal neurological deficits. Extremities: Symmetric 5 x 5 power. Skin: No rashes, lesions or ulcers Psychiatry: Judgement and insight appear normal. Mood & affect appropriate.     Data Reviewed: I have personally reviewed following labs and  imaging studies  CBC: Recent Labs  Lab 11/09/19 2250 11/10/19 2347 11/11/19 0207 11/11/19 0604  WBC 10.8* 11.8* 12.9* 8.0  NEUTROABS 5.1 9.8*  --  7.5  HGB 14.1 15.2 13.7 14.1  HCT 42.2 45.7 40.9 42.0  MCV 86.7 85.9 86.3 85.4  PLT 257 286 269 255   Basic Metabolic Panel: Recent Labs  Lab 11/09/19 2250 11/10/19 2347 11/11/19 0207 11/11/19 0604  NA 132* 138  --  137  K 4.1 5.2*  --  4.8  CL 98 101  --  100  CO2 26 22  --  27  GLUCOSE 97 116*  --  150*  BUN 16 14  --  13  CREATININE 1.47* 1.37* 1.37* 1.35*  CALCIUM 9.0 9.7  --  9.5   GFR: Estimated Creatinine Clearance: 74.4 mL/min (A) (by C-G formula based on SCr of 1.35 mg/dL (H)). Liver Function Tests: Recent Labs  Lab 11/11/19 0604  AST 26  ALT 23  ALKPHOS 76  BILITOT 0.4  PROT 7.7  ALBUMIN 3.9   No results for input(s): LIPASE, AMYLASE in the last 168 hours. No results for input(s): AMMONIA in the last 168 hours. Coagulation Profile: No results for input(s): INR, PROTIME in the last 168 hours. Cardiac Enzymes: No results for input(s): CKTOTAL, CKMB, CKMBINDEX, TROPONINI in the last 168 hours. BNP (last 3 results) No results for input(s): PROBNP in the last 8760 hours. HbA1C: No results for input(s): HGBA1C in the last 72 hours. CBG: No results for input(s): GLUCAP in the last 168 hours. Lipid Profile: No results for input(s): CHOL, HDL, LDLCALC, TRIG, CHOLHDL, LDLDIRECT in the last 72 hours. Thyroid Function Tests: No results for input(s): TSH, T4TOTAL, FREET4, T3FREE, THYROIDAB in the last 72 hours. Anemia Panel: No results for input(s): VITAMINB12, FOLATE, FERRITIN, TIBC, IRON, RETICCTPCT in the last 72 hours. Sepsis Labs: No results for input(s): PROCALCITON, LATICACIDVEN in the last 168 hours.  Recent Results (from the past 240 hour(s))  SARS CORONAVIRUS 2 (TAT 6-24 HRS) Nasopharyngeal Nasopharyngeal Swab     Status: None   Collection Time: 11/09/19 11:44 PM   Specimen: Nasopharyngeal Swab    Result Value Ref Range Status   SARS Coronavirus 2 NEGATIVE NEGATIVE Final    Comment: (NOTE) SARS-CoV-2 target nucleic acids are NOT DETECTED. The SARS-CoV-2 RNA is generally detectable in upper and lower respiratory specimens during the acute phase of infection. Negative results do not preclude SARS-CoV-2 infection, do not rule out co-infections with other pathogens, and should not be used as the sole basis for treatment or other patient management decisions. Negative results must be combined with clinical observations, patient history, and epidemiological information. The expected result is Negative. Fact Sheet for Patients: HairSlick.no Fact Sheet for Healthcare Providers: quierodirigir.com This test is not yet approved or cleared by the [place] FDA and  has been authorized for detection and/or diagnosis of SARS-CoV-2 by FDA under an Emergency Use Authorization (EUA). This EUA will remain  in effect (meaning this test can be used) for the duration of the COVID-19 declaration under Section 56 4(b)(1) of the Act,  21 U.S.C. section 360bbb-3(b)(1), unless the authorization is terminated or revoked sooner. Performed at [facility], [place] [address], [place], [place] [address]          Radiology Studies: DG Chest Port 1 View  Result Date: 11/10/2019 CLINICAL DATA:  Shortness of breath, asthma EXAM: PORTABLE CHEST 1 VIEW COMPARISON:  11/09/2019 FINDINGS: Heart and mediastinal contours are within normal limits. No focal opacities or effusions. No acute bony abnormality. IMPRESSION: No active disease. Electronically Signed   By: Rolm Baptise M.D.   On: 11/10/2019 23:54        Scheduled Meds: . albuterol  2.5 mg Nebulization Q4H  . azithromycin  500 mg Oral Daily  . budesonide (PULMICORT) nebulizer solution  0.25 mg Nebulization BID  . enoxaparin (LOVENOX) injection  40 mg Subcutaneous Q24H  . methylPREDNISolone  (SOLU-MEDROL) injection  40 mg Intravenous Q12H  . polyethylene glycol  17 g Oral Daily   Continuous Infusions: . cefTRIAXone (ROCEPHIN)  IV 1 g (11/12/19 0848)     LOS: 1 day    Time spent: 25 minutes    Barb Merino, MD [facility] Pager [phone]

## 2019-11-12 NOTE — Plan of Care (Signed)

## 2019-11-13 ENCOUNTER — Inpatient Hospital Stay (HOSPITAL_COMMUNITY): Payer: Self-pay

## 2019-11-13 LAB — CBC WITH DIFFERENTIAL/PLATELET
Abs Immature Granulocytes: 0.03 10*3/uL (ref 0.00–0.07)
Basophils Absolute: 0 10*3/uL (ref 0.0–0.1)
Basophils Relative: 0 %
Eosinophils Absolute: 0 10*3/uL (ref 0.0–0.5)
Eosinophils Relative: 0 %
HCT: 44 % (ref 39.0–52.0)
Hemoglobin: 14.8 g/dL (ref 13.0–17.0)
Immature Granulocytes: 0 %
Lymphocytes Relative: 9 %
Lymphs Abs: 0.9 10*3/uL (ref 0.7–4.0)
MCH: 28.8 pg (ref 26.0–34.0)
MCHC: 33.6 g/dL (ref 30.0–36.0)
MCV: 85.8 fL (ref 80.0–100.0)
Monocytes Absolute: 0.5 10*3/uL (ref 0.1–1.0)
Monocytes Relative: 5 %
Neutro Abs: 8.4 10*3/uL — ABNORMAL HIGH (ref 1.7–7.7)
Neutrophils Relative %: 86 %
Platelets: 283 10*3/uL (ref 150–400)
RBC: 5.13 MIL/uL (ref 4.22–5.81)
RDW: 13.6 % (ref 11.5–15.5)
WBC: 9.9 10*3/uL (ref 4.0–10.5)
nRBC: 0 % (ref 0.0–0.2)

## 2019-11-13 LAB — COMPREHENSIVE METABOLIC PANEL
ALT: 24 U/L (ref 0–44)
AST: 23 U/L (ref 15–41)
Albumin: 3.7 g/dL (ref 3.5–5.0)
Alkaline Phosphatase: 93 U/L (ref 38–126)
Anion gap: 11 (ref 5–15)
BUN: 20 mg/dL (ref 6–20)
CO2: 26 mmol/L (ref 22–32)
Calcium: 9.2 mg/dL (ref 8.9–10.3)
Chloride: 97 mmol/L — ABNORMAL LOW (ref 98–111)
Creatinine, Ser: 1.28 mg/dL — ABNORMAL HIGH (ref 0.61–1.24)
GFR calc Af Amer: >60 mL/min (ref >60)
GFR calc non Af Amer: >60 mL/min (ref >60)
Glucose, Bld: 160 mg/dL — ABNORMAL HIGH (ref 70–99)
Potassium: 4.4 mmol/L (ref 3.5–5.1)
Sodium: 134 mmol/L — ABNORMAL LOW (ref 135–145)
Total Bilirubin: 0.5 mg/dL (ref 0.3–1.2)
Total Protein: 7.5 g/dL (ref 6.5–8.1)

## 2019-11-13 LAB — URINALYSIS, ROUTINE W REFLEX MICROSCOPIC
Bilirubin Urine: NEGATIVE
Glucose, UA: NEGATIVE mg/dL
Hgb urine dipstick: NEGATIVE
Ketones, ur: NEGATIVE mg/dL
Leukocytes,Ua: NEGATIVE
Nitrite: NEGATIVE
Protein, ur: NEGATIVE mg/dL
Specific Gravity, Urine: 1.012 (ref 1.005–1.030)
pH: 6 (ref 5.0–8.0)

## 2019-11-13 LAB — GLUCOSE, CAPILLARY: Glucose-Capillary: 106 mg/dL — ABNORMAL HIGH (ref 70–99)

## 2019-11-13 LAB — MAGNESIUM: Magnesium: 2.3 mg/dL (ref 1.7–2.4)

## 2019-11-13 LAB — PHOSPHORUS: Phosphorus: 3.6 mg/dL (ref 2.5–4.6)

## 2019-11-13 MED ORDER — METHYLPREDNISOLONE SODIUM SUCC 40 MG IJ SOLR
40.0000 mg | Freq: Every day | INTRAMUSCULAR | Status: DC
  Administered 2019-11-14: 40 mg via INTRAVENOUS
  Filled 2019-11-13: qty 1

## 2019-11-13 MED ORDER — LACTATED RINGERS IV SOLN: INTRAVENOUS | Status: AC

## 2019-11-13 NOTE — Progress Notes (Signed)
Per MD request to ambulate pt in hallway q 4hr. Pt ambulated from room to around nurses station hallway- back down entire hallway then to room. Pt O2 sats 97% Hr 98.

## 2019-11-13 NOTE — Progress Notes (Signed)
PROGRESS NOTE    Nicholas Ochoa  HDQ:[id number] DOB: 1981/03/03 DOA: 11/10/2019 PCP: Anne Ng, NP    Brief Narrative:  HPI: Nicholas Ochoa is a 39 y.o. male with history of bronchial asthma poorly controlled had been treated for pneumonia in February 2021, not on maintenance steroid inhaler at home , admitted 1 day ago at [facility] for asthma exacerbation, left AGAINST MEDICAL ADVICE, came back to emergency room with worsening shortness of breath.  Has productive cough with mucoid sputum.  COVID-19 was negative.  Gets frequent asthma exacerbations.    ED Course: In the ER patient was afebrile requiring 4 L oxygen able to talk but is diffusely wheezing.  Chest x-ray unremarkable.  Labs show potassium of 5.2 creatinine 1.3 WBC count of 11.8.  Despite giving continuous nebulizer patient still wheezing and admitted for further management of asthma exacerbation. CT scan of the chest on 4/7 with evidence of mucoid debris on his bronchus.   Assessment & Plan:   Acute on chronic asthma exacerbation with history of moderate persistent asthma, acute hypoxic respiratory failure:   He is clinically improving but still has wheezing, start tapering IV steroids, continue inhaled steroids and albuterol combination, try to titrate off oxygen, advance activity, does have some evidence of mucous plugging on recent outpt CT chest, encouraged to sit up in chair and use flutter valve for pulmonary toiletry.  Advance activity and monitor.  Acute renal failure:  ?  If he has underlying CKD 3, creatinine 1 year ago was close to 1.3 as well, will check UA and renal ultrasound along with gentle hydration.  Repeat BMP in the morning.     DVT prophylaxis: Lovenox subcu Code Status: Full code Family Communication: Discussed with patient Disposition Plan: patient is from home. Anticipated DC to home, Barriers to discharge with significant shortness of breath and hypoxemia, he will continue to need treatment in the  hospital, IV medications, respiratory therapy care.   Consultants:   None  Procedures:   Recent outpatient CT chest.  Mucous plugging.    Renal ultrasound.  Antimicrobials:  Anti-infectives (From admission, onward)   Start     Dose/Rate Route Frequency Ordered Stop   11/11/19 1000  azithromycin (ZITHROMAX) tablet 500 mg     500 mg Oral Daily 11/11/19 0737 11/16/19 0959   11/11/19 0800  cefTRIAXone (ROCEPHIN) 1 g in sodium chloride 0.9 % 100 mL IVPB  Status:  Discontinued     1 g 200 mL/hr over 30 Minutes Intravenous Every 24 hours 11/11/19 0737 11/13/19 0922         Subjective:  Patient in bed, appears comfortable, denies any headache, no fever, no chest pain or pressure, have mild cough, ongoing wheezing but improved shortness of breath, no abdominal pain. No focal weakness.   Objective: Vitals:   11/12/19 2300 11/13/19 0338 11/13/19 0817 11/13/19 0827  BP: 132/78   130/76  Pulse: 63 73  76  Resp: 18 16  18   Temp: 98.1 F (36.7 C)   97.8 F (36.6 C)  TempSrc: Oral   Oral  SpO2: 96% 94% (!) 88% 95%  Weight:      Height:        Intake/Output Summary (Last 24 hours) at 11/13/2019 1041 Last data filed at 11/13/2019 0800 Gross per 24 hour  Intake 240 ml  Output --  Net 240 ml   Filed Weights   11/10/19 2340  Weight: 80 kg    Examination:  Awake Alert, No new  F.N deficits, Normal affect [place].AT,PERRAL Supple Neck,No JVD, No cervical lymphadenopathy appriciated.  Symmetrical Chest wall movement, Good air movement bilaterally, CTAB RRR,No Gallops, Rubs or new Murmurs, No Parasternal Heave +ve B.Sounds, Abd Soft, No tenderness, No organomegaly appriciated, No rebound - guarding or rigidity. No Cyanosis, Clubbing or edema, No new Rash or bruise   Data Reviewed: I have personally reviewed following labs and imaging studies  CBC: Recent Labs  Lab 11/09/19 2250 11/10/19 2347 11/11/19 0207 11/11/19 0604 11/13/19 0246  WBC 10.8* 11.8* 12.9* 8.0 9.9   NEUTROABS 5.1 9.8*  --  7.5 8.4*  HGB 14.1 15.2 13.7 14.1 14.8  HCT 42.2 45.7 40.9 42.0 44.0  MCV 86.7 85.9 86.3 85.4 85.8  PLT 257 286 269 255 185   Basic Metabolic Panel: Recent Labs  Lab 11/09/19 2250 11/10/19 2347 11/11/19 0207 11/11/19 0604 11/13/19 0246  NA 132* 138  --  137 134*  K 4.1 5.2*  --  4.8 4.4  CL 98 101  --  100 97*  CO2 26 22  --  27 26  GLUCOSE 97 116*  --  150* 160*  BUN 16 14  --  13 20  CREATININE 1.47* 1.37* 1.37* 1.35* 1.28*  CALCIUM 9.0 9.7  --  9.5 9.2  MG  --   --   --   --  2.3  PHOS  --   --   --   --  3.6   GFR: Estimated Creatinine Clearance: 78.4 mL/min (A) (by C-G formula based on SCr of 1.28 mg/dL (H)). Liver Function Tests: Recent Labs  Lab 11/11/19 0604 11/13/19 0246  AST 26 23  ALT 23 24  ALKPHOS 76 93  BILITOT 0.4 0.5  PROT 7.7 7.5  ALBUMIN 3.9 3.7   No results for input(s): LIPASE, AMYLASE in the last 168 hours. No results for input(s): AMMONIA in the last 168 hours. Coagulation Profile: No results for input(s): INR, PROTIME in the last 168 hours. Cardiac Enzymes: No results for input(s): CKTOTAL, CKMB, CKMBINDEX, TROPONINI in the last 168 hours. BNP (last 3 results) No results for input(s): PROBNP in the last 8760 hours. HbA1C: No results for input(s): HGBA1C in the last 72 hours. CBG: No results for input(s): GLUCAP in the last 168 hours. Lipid Profile: No results for input(s): CHOL, HDL, LDLCALC, TRIG, CHOLHDL, LDLDIRECT in the last 72 hours. Thyroid Function Tests: No results for input(s): TSH, T4TOTAL, FREET4, T3FREE, THYROIDAB in the last 72 hours. Anemia Panel: No results for input(s): VITAMINB12, FOLATE, FERRITIN, TIBC, IRON, RETICCTPCT in the last 72 hours. Sepsis Labs: No results for input(s): PROCALCITON, LATICACIDVEN in the last 168 hours.  Recent Results (from the past 240 hour(s))  SARS CORONAVIRUS 2 (TAT 6-24 HRS) Nasopharyngeal Nasopharyngeal Swab     Status: None   Collection Time: 11/09/19 11:44  PM   Specimen: Nasopharyngeal Swab  Result Value Ref Range Status   SARS Coronavirus 2 NEGATIVE NEGATIVE Final    Comment: (NOTE) SARS-CoV-2 target nucleic acids are NOT DETECTED. The SARS-CoV-2 RNA is generally detectable in upper and lower respiratory specimens during the acute phase of infection. Negative results do not preclude SARS-CoV-2 infection, do not rule out co-infections with other pathogens, and should not be used as the sole basis for treatment or other patient management decisions. Negative results must be combined with clinical observations, patient history, and epidemiological information. The expected result is Negative. Fact Sheet for Patients: SugarRoll.be Fact Sheet for Healthcare Providers: https://www.woods-mathews.com/ This test is not yet  approved or cleared by the [place] and  has been authorized for detection and/or diagnosis of SARS-CoV-2 by FDA under an Emergency Use Authorization (EUA). This EUA will remain  in effect (meaning this test can be used) for the duration of the COVID-19 declaration under Section 56 4(b)(1) of the Act, 21 U.S.C. section 360bbb-3(b)(1), unless the authorization is terminated or revoked sooner. Performed at [facility], [address], [place], [place] [address]       Radiology Studies: No results found.  Scheduled Meds: . azithromycin  500 mg Oral Daily  . budesonide (PULMICORT) nebulizer solution  0.25 mg Nebulization BID  . enoxaparin (LOVENOX) injection  40 mg Subcutaneous Q24H  . ipratropium-albuterol  3 mL Nebulization Q4H  . [START ON 11/14/2019] methylPREDNISolone (SOLU-MEDROL) injection  40 mg Intravenous Daily  . polyethylene glycol  17 g Oral Daily   Continuous Infusions:    LOS: 2 days    Time spent: 25 minutes  Signature  Susa Raring M.D on 11/13/2019 at 10:42 AM   -  To page go to [email or web address]

## 2019-11-13 NOTE — Progress Notes (Signed)
RT note-Patient remains on room air, patient was unavailable earlier and now sleeping. Continue to monitor. Treatment not given at this time.

## 2019-11-14 LAB — CBC
HCT: 42.9 % (ref 39.0–52.0)
Hemoglobin: 14.4 g/dL (ref 13.0–17.0)
MCH: 28.8 pg (ref 26.0–34.0)
MCHC: 33.6 g/dL (ref 30.0–36.0)
MCV: 85.8 fL (ref 80.0–100.0)
Platelets: 207 10*3/uL (ref 150–400)
RBC: 5 MIL/uL (ref 4.22–5.81)
RDW: 13.6 % (ref 11.5–15.5)
WBC: 8.7 10*3/uL (ref 4.0–10.5)
nRBC: 0 % (ref 0.0–0.2)

## 2019-11-14 LAB — COMPREHENSIVE METABOLIC PANEL
ALT: 22 U/L (ref 0–44)
AST: 21 U/L (ref 15–41)
Albumin: 3.4 g/dL — ABNORMAL LOW (ref 3.5–5.0)
Alkaline Phosphatase: 80 U/L (ref 38–126)
Anion gap: 11 (ref 5–15)
BUN: 17 mg/dL (ref 6–20)
CO2: 26 mmol/L (ref 22–32)
Calcium: 9.1 mg/dL (ref 8.9–10.3)
Chloride: 98 mmol/L (ref 98–111)
Creatinine, Ser: 1.33 mg/dL — ABNORMAL HIGH (ref 0.61–1.24)
GFR calc Af Amer: >60 mL/min (ref >60)
GFR calc non Af Amer: >60 mL/min (ref >60)
Glucose, Bld: 96 mg/dL (ref 70–99)
Potassium: 4 mmol/L (ref 3.5–5.1)
Sodium: 135 mmol/L (ref 135–145)
Total Bilirubin: 0.4 mg/dL (ref 0.3–1.2)
Total Protein: 6.8 g/dL (ref 6.5–8.1)

## 2019-11-14 MED ORDER — PREDNISONE 5 MG PO TABS: ORAL_TABLET | ORAL | 0 refills | Status: AC

## 2019-11-14 MED ORDER — AZITHROMYCIN 500 MG PO TABS: 500.0000 mg | ORAL_TABLET | Freq: Every day | ORAL | 0 refills | Status: AC

## 2019-11-14 MED ORDER — BUDESONIDE-FORMOTEROL FUMARATE 160-4.5 MCG/ACT IN AERO: 2.0000 | INHALATION_SPRAY | Freq: Two times a day (BID) | RESPIRATORY_TRACT | 0 refills | Status: DC

## 2019-11-14 MED ORDER — ALBUTEROL SULFATE HFA 108 (90 BASE) MCG/ACT IN AERS: 1.0000 | INHALATION_SPRAY | Freq: Four times a day (QID) | RESPIRATORY_TRACT | 1 refills | Status: AC | PRN

## 2019-11-14 NOTE — Discharge Summary (Signed)
Nicholas Ochoa XHB:[id number] DOB: Jan 28, 1981 DOA: 11/10/2019  PCP: Nicholas Buffy, NP  Admit date: 11/10/2019  Discharge date: 11/14/2019  Admitted From: Home   Disposition:  Home   Recommendations for Outpatient Follow-up:   Follow up with PCP in 1-2 weeks  PCP Please obtain BMP/CBC, 2 view CXR in 1week,  (see Discharge instructions)   PCP Please follow up on the following pending results:    Home Health: None   Equipment/Devices: None  Consultations: None  Discharge Condition: Stable    CODE STATUS: Full    Diet Recommendation: Heart Healthy  Diet Order            Diet - low sodium heart healthy        Diet regular Room service appropriate? Yes; Fluid consistency: Thin  Diet effective now               Chief Complaint  Patient presents with  . Wheezing    Asthma  . Shortness of Breath     Brief history of present illness from the day of admission and additional interim summary    [place] a 39 y.o.malewithhistory of bronchial asthma poorly controlled had been treated for pneumonia in February 2021, not on maintenance steroid inhaler at home , admitted 1 day ago at [facility] for asthma exacerbation, left AGAINST MEDICAL ADVICE, came back to emergency room with worsening shortness of breath.  Has productive cough with mucoid sputum.  COVID-19 was negative.  Gets frequent asthma exacerbations.                                                                   Hospital Course   Acute on chronic asthma exacerbation with history of moderate persistent asthma, acute hypoxic respiratory failure:   He has clinically come back to see him closely on room air, he was treated with IV steroids, pulmonary toiletry along with antibiotics for treatment of bronchial bacterial infection.  He is now symptom-free,  stable on room air will be discharged home on 3 more days of oral azithromycin, steroid taper along with oral and inhaled steroids.  Case management to assist with medication procurement.   Acute renal failure on CKD 3 :   back to baseline post IVF, PCP to monitor, Renal US non acute.     Discharge diagnosis     Principal Problem:   Asthma exacerbation Active Problems:   Acute asthma exacerbation    Discharge instructions    Discharge Instructions    Diet - low sodium heart healthy   Complete by: As directed    Discharge instructions   Complete by: As directed    Follow with Primary MD Nche, Nicholas Brooke, NP in 7 days   Get CBC, CMP, 2 view Chest X ray -  checked next visit within 1 week by Primary MD    Activity: As tolerated with Full fall precautions use walker/cane & assistance as needed  Disposition Home   Diet: Heart Healthy    Special Instructions: If you have smoked or chewed Tobacco  in the last 2 yrs please stop smoking, stop any regular Alcohol  and or any Recreational drug use.  On your next visit with your primary care physician please Get Medicines reviewed and adjusted.  Please request your Prim.MD to go over all Hospital Tests and Procedure/Radiological results at the follow up, please get all Hospital records sent to your Prim MD by signing hospital release before you go home.  If you experience worsening of your admission symptoms, develop shortness of breath, life threatening emergency, suicidal or homicidal thoughts you must seek medical attention immediately by calling 911 or calling your MD immediately  if symptoms less severe.  You Must read complete instructions/literature along with all the possible adverse reactions/side effects for all the Medicines you take and that have been prescribed to you. Take any new Medicines after you have completely understood and accpet all the possible adverse reactions/side effects.   Increase activity slowly    Complete by: As directed       Discharge Medications   Allergies as of 11/14/2019   No Known Allergies     Medication List    STOP taking these medications   fluticasone 110 MCG/ACT inhaler Commonly known as: FLOVENT HFA   predniSONE 20 MG tablet Commonly known as: DELTASONE Replaced by: predniSONE 5 MG tablet     TAKE these medications   albuterol 108 (90 Base) MCG/ACT inhaler Commonly known as: VENTOLIN HFA Inhale 1 puff into the lungs every 6 (six) hours as needed for wheezing or shortness of breath.   azithromycin 500 MG tablet Commonly known as: Zithromax Take 1 tablet (500 mg total) by mouth daily.   budesonide-formoterol 160-4.5 MCG/ACT inhaler Commonly known as: Symbicort Inhale 2 puffs into the lungs in the morning and at bedtime.   predniSONE 5 MG tablet Commonly known as: DELTASONE Label  & dispense according to the schedule below. take 8 Pills PO for 3 days, 6 Pills PO for 3 days, 4 Pills PO for 3 days, 2 Pills PO for 3 days, 1 Pills PO for 3 days, 1/2 Pill  PO for 3 days then STOP. Total 65 pills. Replaces: predniSONE 20 MG tablet       Follow-up Information    Nche, Nicholas Gains, NP. Schedule an appointment as soon as possible for a visit in 1 week(s).   Specialty: Internal Medicine Contact information: [address] [place] [address] [phone]           Major procedures and Radiology Reports - PLEASE review detailed and final reports thoroughly  -        CT CHEST W CONTRAST  Result Date: 11/10/2019 CLINICAL DATA:  Frequent asthma exacerbation EXAM: CT CHEST WITH CONTRAST TECHNIQUE: Multidetector CT imaging of the chest was performed during intravenous contrast administration. CONTRAST:  53mL OMNIPAQUE IOHEXOL 300 MG/ML  SOLN COMPARISON:  05/31/2019 FINDINGS: Cardiovascular: Normal heart size.  Thoracic aorta is unremarkable. Mediastinum/Nodes: No new adenopathy. Included thyroid is unremarkable. Lungs/Pleura: No  consolidation or mass. Areas of mild tree-in-bud nodularity in the prior study have improved. However, there are new patchy areas of interstitial thickening and nodularity primarily in the right lower lobe (series 3, image 83 and image 91). Mild peribronchial thickening. Multifocal mucous  plugging again noted. No pleural effusion or pneumothorax. Upper Abdomen: No acute abnormality. Musculoskeletal: No chest wall abnormality. No acute or significant osseous findings. IMPRESSION: Persistent multifocal mucous plugging and peribronchial thickening. New patchy small areas of interstitial thickening and nodularity primarily in the right lower lobe. Given history, consider ABPA. Electronically Signed   By: Guadlupe Spanish M.D.   On: 11/10/2019 07:40   US RENAL  Result Date: 11/13/2019 CLINICAL DATA:  Initial evaluation for acute renal insufficiency. EXAM: RENAL / URINARY TRACT ULTRASOUND COMPLETE COMPARISON:  None. FINDINGS: Right Kidney: Renal measurements: 9.1 x 4.5 x 5.3 cm = volume: 111.6 mL. Mildly increased echogenicity within the renal parenchyma with poor corticomedullary differentiation. No nephrolithiasis or hydronephrosis. No focal renal mass. Left Kidney: Renal measurements: 7.8 x 3.4 x 4.2 cm = volume: 55.7 mL. Left kidney not well seen due to shadowing from overlying bowel gas. The visualized left kidney is atrophic in appearance. Renal echogenicity not well assessed. No visible nephrolithiasis or hydronephrosis. No focal renal mass. Bladder: Appears normal for degree of bladder distention. Bilateral ureteral jets are visualized. Other: None. IMPRESSION: 1. Increased echogenicity within the right renal parenchyma, suggesting medical renal disease. 2. Poor visualization of the left kidney, with suggestion of asymmetric left renal atrophy. 3. No hydronephrosis. Electronically Signed   By: Rise Mu M.D.   On: 11/13/2019 22:49   DG Chest Port 1 View  Result Date: 11/13/2019 CLINICAL DATA:   Wheezing with shortness of breath. EXAM: PORTABLE CHEST 1 VIEW COMPARISON:  Radiographs 11/10/2019 and 11/09/2019. CT 11/10/2019. FINDINGS: 1127 hours. The heart size and mediastinal contours are stable. The lungs appear clear. There was diffuse central airway thickening and mucous plugging on recent CT, not clearly visible radiographically. There is no pleural effusion or pneumothorax. The bones appear unremarkable. IMPRESSION: No acute cardiopulmonary process. Previously demonstrated central airway thickening on CT is not clearly visible radiographically. Electronically Signed   By: Carey Bullocks M.D.   On: 11/13/2019 12:15   DG Chest Port 1 View  Result Date: 11/10/2019 CLINICAL DATA:  Shortness of breath, asthma EXAM: PORTABLE CHEST 1 VIEW COMPARISON:  11/09/2019 FINDINGS: Heart and mediastinal contours are within normal limits. No focal opacities or effusions. No acute bony abnormality. IMPRESSION: No active disease. Electronically Signed   By: Charlett Nose M.D.   On: 11/10/2019 23:54   DG Chest Port 1 View  Result Date: 11/09/2019 CLINICAL DATA:  Asthma attack EXAM: PORTABLE CHEST 1 VIEW COMPARISON:  09/21/2019 FINDINGS: The heart size and mediastinal contours are within normal limits. Both lungs are clear. The visualized skeletal structures are unremarkable. IMPRESSION: Negative. Electronically Signed   By: Charlett Nose M.D.   On: 11/09/2019 22:51    Micro Results     Recent Results (from the past 240 hour(s))  SARS CORONAVIRUS 2 (TAT 6-24 HRS) Nasopharyngeal Nasopharyngeal Swab     Status: None   Collection Time: 11/09/19 11:44 PM   Specimen: Nasopharyngeal Swab  Result Value Ref Range Status   SARS Coronavirus 2 NEGATIVE NEGATIVE Final    Comment: (NOTE) SARS-CoV-2 target nucleic acids are NOT DETECTED. The SARS-CoV-2 RNA is generally detectable in upper and lower respiratory specimens during the acute phase of infection. Negative results do not preclude SARS-CoV-2 infection, do not  rule out co-infections with other pathogens, and should not be used as the sole basis for treatment or other patient management decisions. Negative results must be combined with clinical observations, patient history, and epidemiological information. The expected result is Negative.  Fact Sheet for Patients: HairSlick.no Fact Sheet for Healthcare Providers: quierodirigir.com This test is not yet approved or cleared by the [place] FDA and  has been authorized for detection and/or diagnosis of SARS-CoV-2 by FDA under an Emergency Use Authorization (EUA). This EUA will remain  in effect (meaning this test can be used) for the duration of the COVID-19 declaration under Section 56 4(b)(1) of the Act, 21 U.S.C. section 360bbb-3(b)(1), unless the authorization is terminated or revoked sooner. Performed at [facility], [address], [place], [place] [address]     Today   Subjective    Nicholas Ochoa today has no headache,no chest abdominal pain,no new weakness tingling or numbness, feels much better wants to go home today.     Objective   Blood pressure 126/83, pulse 89, temperature 97.9 F (36.6 C), resp. rate 20, height 5\' 10"  (1.778 m), weight 80 kg, SpO2 97 %.  No intake or output data in the 24 hours ending 11/14/19 0827  Exam Awake Alert, Oriented x 3, No new F.N deficits, Normal affect [place].AT,PERRAL Supple Neck,No JVD, No cervical lymphadenopathy appriciated.  Symmetrical Chest wall movement, Good air movement bilaterally, CTAB RRR,No Gallops,Rubs or new Murmurs, No Parasternal Heave +ve B.Sounds, Abd Soft, Non tender, No organomegaly appriciated, No rebound -guarding or rigidity. No Cyanosis, Clubbing or edema, No new Rash or bruise   Data Review   CBC w Diff:  Lab Results  Component Value Date   WBC 8.7 11/14/2019   HGB 14.4 11/14/2019   HCT 42.9 11/14/2019   PLT 207 11/14/2019   LYMPHOPCT 9 11/13/2019     MONOPCT 5 11/13/2019   EOSPCT 0 11/13/2019   BASOPCT 0 11/13/2019    CMP:  Lab Results  Component Value Date   NA 135 11/14/2019   K 4.0 11/14/2019   CL 98 11/14/2019   CO2 26 11/14/2019   BUN 17 11/14/2019   CREATININE 1.33 (H) 11/14/2019   PROT 6.8 11/14/2019   ALBUMIN 3.4 (L) 11/14/2019   BILITOT 0.4 11/14/2019   ALKPHOS 80 11/14/2019   AST 21 11/14/2019   ALT 22 11/14/2019  .   Total Time in preparing paper work, data evaluation and todays exam - 35 minutes  01/14/2020 M.D on 11/14/2019 at 8:27 AM  [facility]   Office  [phone]

## 2019-11-14 NOTE — Plan of Care (Signed)

## 2019-11-14 NOTE — Discharge Instructions (Signed)
Follow with Primary MD Nche, Bonna Gains, NP in 7 days   Get CBC, CMP, 2 view Chest X ray -  checked next visit within 1 week by Primary MD    Activity: As tolerated with Full fall precautions use walker/cane & assistance as needed  Disposition Home   Diet: Heart Healthy    Special Instructions: If you have smoked or chewed Tobacco  in the last 2 yrs please stop smoking, stop any regular Alcohol  and or any Recreational drug use.  On your next visit with your primary care physician please Get Medicines reviewed and adjusted.  Please request your Prim.MD to go over all Hospital Tests and Procedure/Radiological results at the follow up, please get all Hospital records sent to your Prim MD by signing hospital release before you go home.  If you experience worsening of your admission symptoms, develop shortness of breath, life threatening emergency, suicidal or homicidal thoughts you must seek medical attention immediately by calling 911 or calling your MD immediately  if symptoms less severe.  You Must read complete instructions/literature along with all the possible adverse reactions/side effects for all the Medicines you take and that have been prescribed to you. Take any new Medicines after you have completely understood and accpet all the possible adverse reactions/side effects.

## 2019-11-14 NOTE — TOC Transition Note (Signed)
Transition of Care [facility]) - CM/SW Discharge Note   Patient Details  Name: Mantaj Chamberlin MRN: [id number] Date of Birth: 1980/10/28  Transition of Care [facility] CM/SW Contact:  Lawerance Sabal, RN Phone Number: 11/14/2019, 8:53 AM   Clinical Narrative:    Patient to DC to home. Provided with MATCH letter. Confirms he has PCP.  Discussed applying for insurance or medicaid.           Patient Goals and CMS Choice        Discharge Placement                       Discharge Plan and Services                                     Social Determinants of Health (SDOH) Interventions     Readmission Risk Interventions No flowsheet data found.

## 2019-11-15 ENCOUNTER — Other Ambulatory Visit: Payer: Self-pay

## 2019-11-15 ENCOUNTER — Telehealth: Payer: Self-pay | Admitting: *Deleted

## 2019-11-15 NOTE — Telephone Encounter (Signed)
Called pt for TCM hospital follow up. Pt states he is doing much much better. Pt has follow up appt with pulmonology scheduled and states he does not see need for PCP appt at this time. Pt agrees to call office with any needs and call 911 or go to ER in case of emergency.

## 2019-11-15 NOTE — Telephone Encounter (Signed)
1st attempt. Unable to reach patient.   

## 2019-11-18 NOTE — Telephone Encounter (Signed)
Schedule patient for bronchoscopy and washings

## 2019-11-18 NOTE — Telephone Encounter (Signed)
Per Leotis Shames - please fill out the bronch order details dot phrase with all pertinent information.

## 2019-11-19 ENCOUNTER — Telehealth: Payer: Self-pay | Admitting: Pulmonary Disease

## 2019-11-19 NOTE — Telephone Encounter (Signed)
Information on bronchoscopy sent to Patient through my chart.  Nothing further at this time.

## 2019-11-19 NOTE — Telephone Encounter (Signed)
Bronch info for patient  Flexible Bronchoscopy   Flexible bronchoscopy is a procedure that is used to examine the passageways in the lungs. During the procedure, a thin, flexible tool with a camera on it (bronchoscope) is passed into the mouth or nose, down through the windpipe (trachea), and into the air tubes (bronchi) in the lungs. This tool allows your health care provider to look at your lungs from the inside and take testing (diagnostic) samples if needed. Tell a health care provider about:  Any allergies you have.  All medicines you are taking, including vitamins, herbs, eye drops, creams, and over-the-counter medicines.  Any problems you or family members have had with anesthetic medicines.  Any blood disorders you have.  Any surgeries you have had.  Any medical conditions you have.  Whether you are pregnant or may be pregnant. What are the risks? Generally, this is a safe procedure. However, problems may occur, including:  Infection.  Bleeding.  Damage to other structures or organs.  Allergic reactions to medicines.  Collapsed lung (pneumothorax).  Increased need for oxygen or difficulty breathing after the procedure. What happens before the procedure? Medicines Ask your health care provider about:  Changing or stopping your regular medicines. This is especially important if you are taking diabetes medicines or blood thinners.  Taking medicines such as aspirin and ibuprofen. These medicines can thin your blood. Do not take these medicines before your procedure if your health care provider instructs you not to. You may be given antibiotic medicine to help prevent infection. Staying hydrated Follow instructions from your health care provider about hydration, which may include:  Up to 2 hours before the procedure - you may continue to drink clear liquids, such as water, clear fruit juice, black coffee, and plain tea. Eating and drinking Follow instructions from  your health care provider about eating and drinking, which may include:  8 hours before the procedure - stop eating heavy meals or foods such as meat, fried foods, or fatty foods.  6 hours before the procedure - stop eating light meals or foods, such as toast or cereal.  6 hours before the procedure - stop drinking milk or drinks that contain milk.  2 hours before the procedure - stop drinking clear liquids. General instructions  Plan to have someone take you home from the hospital or clinic.  If you will be going home right after the procedure, plan to have someone with you for 24 hours. What happens during the procedure?  To lower your risk of infection: ? Your health care team will wash or sanitize their hands. ? Your skin will be washed with soap.  An IV tube will be inserted into one of your veins.  You will be given a medicine (local anesthetic) to numb your mouth, nose, throat, and voice box (larynx). You may also be given one or more of the following: ? A medicine to help you relax (sedative). ? A medicine to control coughing. ? A medicine to dry up any fluids in your lungs (secretions).  A bronchoscope will be passed into your nose or mouth, and into your lungs. Your health care provider will examine your lungs.  Samples of airway secretions may be collected for testing.  If abnormal areas are seen in your airways, tissue samples may be removed for examination under a microscope (biopsy).  If tissue samples are needed from the outer parts of the lung, a type of X-ray (fluoroscopy) may be used to guide the bronchoscope  to these areas.  If bleeding occurs, you may be given medicine to stop or decrease the bleeding. The procedure may vary among health care providers and hospitals. What happens after the procedure?  Do not drive for 24 hours if you were given a sedative.  Your blood pressure, heart rate, breathing rate, and blood oxygen level will be monitored until the  medicines you were given have worn off.  You may have a chest X-ray to check for signs of pneumothorax.  You will not be allowed to eat or drink anything for 2 hours after your procedure.  If a biopsy was taken, it is up to you to get the results of your procedure. Ask your health care provider, or the department that is doing the procedure, when your results will be ready. Summary  Flexible bronchoscopy is a procedure that allows your health care provider to look closely at your lungs from the inside and take testing (diagnostic) samples if needed.  Risks of flexible bronchoscopy include bleeding, infection, and pneumothorax.  Before a flexible bronchoscopy, you will be given a medicine (local anesthetic) to numb your mouth, nose, throat, and voice box (larynx). Then, a bronchoscope will be passed into your nose or mouth, and into your lungs.  After the procedure, your blood pressure, heart rate, breathing rate, and blood oxygen level will be monitored until the medicines you were given have worn off. You may have a chest X-ray to check for signs of pneumothorax.  You will not be allowed to eat or drink anything for 2 hours after your procedure. This information is not intended to replace advice given to you by your health care provider. Make sure you discuss any questions you have with your health care provider. Document Revised: 07/04/2017 Document Reviewed: 08/24/2016 Elsevier Patient Education  2020 [employer].

## 2019-11-22 NOTE — Telephone Encounter (Signed)
Dr. Wynona Neat patient is concern and sent the following email.  Ok my main concern is a collapse lung... What happens if during a proceedure someone lung callapse?   Can you please advise on response.

## 2019-11-22 NOTE — Telephone Encounter (Signed)
We are not planning to take biopsies  We will be instilling sterile water into the lung to get samples  Risk of lung collapsing is very low if you are not getting biopsies, with biopsies about 2-3%  We keep the patient in the hospital, place a chest tube and wait for the lung to seal- not different from any punture injury- 2-3 days

## 2019-11-23 ENCOUNTER — Telehealth: Payer: Self-pay | Admitting: Pulmonary Disease

## 2019-11-23 NOTE — Telephone Encounter (Signed)
Please schedule the following:  Diagnosis: Recurrent bronchitis, mucous plugging Procedure: Bronchoscopy with washings Anesthesia: Deep sedation Priority: Not urgent Date: Flexible Alternate Date: Flexible Time: AM flexible/ PM stable Location: Flexible Does patient have OSA?  No DM?   Or Latex allergy?  No Medication Restriction: None Anticoagulate/Antiplatelet: None Pre-op Labs Ordered: CBC, CMP, PT/INR, PTT Imaging request: None (If, SuperDimension CT Chest, please have STAT courier sent to [facility] [address].)  Please coordinate Pre-op COVID Testing

## 2019-11-24 SURGERY — VIDEO BRONCHOSCOPY WITHOUT FLUORO
Anesthesia: Monitor Anesthesia Care

## 2019-11-24 NOTE — Telephone Encounter (Signed)
Called and spoke with patient, he did not want to have procedure as he believes the mediation is working and will wait to see what the Xray says when he come for his OV on 11/30/19.  Called Endo and cancelled procedure for patient Nothing further needed at this time.

## 2019-11-29 ENCOUNTER — Other Ambulatory Visit: Payer: Self-pay

## 2019-11-29 ENCOUNTER — Ambulatory Visit (INDEPENDENT_AMBULATORY_CARE_PROVIDER_SITE_OTHER)
Admission: RE | Admit: 2019-11-29 | Discharge: 2019-11-29 | Disposition: A | Discharge status: Home / Self Care | Payer: Self-pay | Source: Ambulatory Visit | Attending: Pulmonary Disease | Admitting: Pulmonary Disease

## 2019-11-29 DIAGNOSIS — J189 Pneumonia, unspecified organism: Secondary | ICD-10-CM

## 2019-11-29 MED ORDER — IOHEXOL 300 MG/ML  SOLN
80.0000 mL | Freq: Once | INTRAMUSCULAR | Status: AC | PRN
  Administered 2019-11-29: 80 mL via INTRAVENOUS

## 2019-11-30 ENCOUNTER — Encounter: Payer: Self-pay | Admitting: Pulmonary Disease

## 2019-11-30 ENCOUNTER — Ambulatory Visit (INDEPENDENT_AMBULATORY_CARE_PROVIDER_SITE_OTHER): Payer: Self-pay | Admitting: Pulmonary Disease

## 2019-11-30 VITALS — BP 98/68 | HR 69 | Temp 98.0°F | Ht 70.5 in | Wt 161.2 lb

## 2019-11-30 DIAGNOSIS — J9809 Other diseases of bronchus, not elsewhere classified: Secondary | ICD-10-CM

## 2019-11-30 DIAGNOSIS — T17500A Unspecified foreign body in bronchus causing asphyxiation, initial encounter: Secondary | ICD-10-CM

## 2019-11-30 DIAGNOSIS — J479 Bronchiectasis, uncomplicated: Secondary | ICD-10-CM

## 2019-11-30 NOTE — Progress Notes (Signed)
Subjective:     Patient ID: Nicholas Ochoa, male   DOB: 09/06/1980, 39 y.o.   MRN: [id number]  Follow-up for recent pneumonia  Patient has had multiple evaluations in the ED and multiple treatment for lower respiratory infection  Has had multiple chest x-rays and CTs revealing mucous plugging, atelectasis that have resolved Current CT does look mildly improved compared to recent about a month ago There is extensive mucus plugging  We had discussed a bronchoscopy in the past and got close to scheduling it, he wants to think about it a little bit more  He stated this may be related to him having asthma background and is living environment lives in exposed to mold, some other particulates which seem to trigger his symptoms  Symptoms are controlled at present, appetite is good, no weight loss  Denies any history of immunocompromise Stated he had an HIV testing performed about 5-6 years ago and was negative-has not been active  Never smoker No history of illicit drug use  No pets  He works Holiday representative  Albuterol seems to work well enough for him at present  Past Medical History:  Diagnosis Date  . Asthma    Social History   Socioeconomic History  . Marital status: Single    Spouse name: Not on file  . Number of children: 0  . Years of education: Not on file  . Highest education level: 10th grade  Occupational History    Comment: Unemployed  Tobacco Use  . Smoking status: Never Smoker  . Smokeless tobacco: Never Used  Substance and Sexual Activity  . Alcohol use: Not Currently  . Drug use: Never  . Sexual activity: Not Currently  Other Topics Concern  . Not on file  Social History Narrative   Unemployed, lives alone   Social Determinants of Health   Financial Resource Strain:   . Difficulty of Paying Living Expenses:   Food Insecurity:   . Worried About Programme researcher, broadcasting/film/video in the Last Year:   . Barista in the Last Year:   Transportation Needs:   . Automotive engineer (Medical):   Marland Kitchen Lack of Transportation (Non-Medical):   Physical Activity:   . Days of Exercise per Week:   . Minutes of Exercise per Session:   Stress:   . Feeling of Stress :   Social Connections:   . Frequency of Communication with Friends and Family:   . Frequency of Social Gatherings with Friends and Family:   . Attends Religious Services:   . Active Member of Clubs or Organizations:   . Attends Banker Meetings:   Marland Kitchen Marital Status:   Intimate Partner Violence:   . Fear of Current or Ex-Partner:   . Emotionally Abused:   Marland Kitchen Physically Abused:   . Sexually Abused:    Review of Systems  Constitutional: Negative for fever and unexpected weight change.  HENT: Positive for dental problem. Negative for congestion, ear pain, nosebleeds, postnasal drip, rhinorrhea, sinus pressure, sneezing, sore throat and trouble swallowing.   Respiratory: Positive for cough. Negative for chest tightness, shortness of breath and wheezing.   Cardiovascular: Negative for palpitations and leg swelling.  Gastrointestinal: Negative for nausea and vomiting.  Musculoskeletal: Negative.   Skin: Negative for rash.  Allergic/Immunologic: Negative.   Psychiatric/Behavioral: Negative.       Objective:   Physical Exam Constitutional:      Appearance: Normal appearance.  HENT:     Head: Normocephalic and atraumatic.  Nose: Nose normal. No congestion or rhinorrhea.     Mouth/Throat:     Mouth: Mucous membranes are moist.  Eyes:     General:        Right eye: No discharge.        Left eye: No discharge.     Extraocular Movements: Extraocular movements intact.     Pupils: Pupils are equal, round, and reactive to light.  Cardiovascular:     Rate and Rhythm: Normal rate and regular rhythm.     Heart sounds: No murmur. No friction rub.  Pulmonary:     Effort: Pulmonary effort is normal. No respiratory distress.     Breath sounds: No stridor. No wheezing or rhonchi.   Musculoskeletal:     Cervical back: Normal range of motion and neck supple. No rigidity. No muscular tenderness.  Lymphadenopathy:     Cervical: No cervical adenopathy.  Neurological:     Deep Tendon Reflexes: Abnormal reflex:      Vitals:   11/30/19 1615  BP: 98/68  Pulse: 69  Temp: 98 F (36.7 C)  SpO2: 97%   Most recent chest x-ray shows a right lower lobe infiltrate Patient reports that repeat chest x-ray did show resolution-this x-ray is not available to me at present  CT scan 11/29/2019 reviewed by myself showing areas of mucous plugging, slight improvement compared to 11/10/2019.  Assessment:     Recent pneumonia Recurrent lower respiratory infections -Symptoms are improving  Mucous plugging -Possibility of atypical mycobacterial infection -I have had multiple discussions with patient about a bronchoscopy -Bronchoscopy was scheduled recently but patient was not ready to go ahead with bronchoscopy at the time.  -With symptoms improving on the present time, he wants to think about his options -I did encourage him to call if he has any questions, I still believe he would benefit from a bronchoscopy Clinically feeling better    History of asthma-continue albuterol as needed   Plan:     Encouraged to call if he changes his mind about this bronchoscopy  Follow-up in 3 months  Encouraged to call with any significant concerns

## 2019-11-30 NOTE — Patient Instructions (Signed)
Bronchiectasis Mucous plugging  Think actively about a bronchoscopy We will be glad to take your questions to become up with any  Next up will be planning bronchoscopy  I will follow-up with you in about 3 months

## 2019-12-07 ENCOUNTER — Other Ambulatory Visit: Payer: Self-pay

## 2020-01-21 ENCOUNTER — Encounter: Payer: Self-pay | Admitting: Nurse Practitioner
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
## 2020-02-21 MED ORDER — BUDESONIDE-FORMOTEROL FUMARATE 160-4.5 MCG/ACT IN AERO: 2.0000 | INHALATION_SPRAY | Freq: Two times a day (BID) | RESPIRATORY_TRACT | 5 refills | Status: DC

## 2020-02-22 MED ORDER — BUDESONIDE-FORMOTEROL FUMARATE 160-4.5 MCG/ACT IN AERO: 2.0000 | INHALATION_SPRAY | Freq: Two times a day (BID) | RESPIRATORY_TRACT | 5 refills | Status: AC

## 2020-02-22 NOTE — Telephone Encounter (Signed)
Rachael, is there any advice for pt so he can be able to get inhalers especially with him being uninsured?

## 2020-02-22 NOTE — Telephone Encounter (Signed)
Just that patient assistance would be recommended, so that he can receive free medication. Some programs require proof of income, but [place] [employer] [place] does not. So he just needs to complete and sign application. Provider page will need to be completed as well.

## 2020-02-22 NOTE — Telephone Encounter (Signed)
Rachael/Amber, can you please see the mychart message thread in regards to pt's Symbicort inhaler and see if you have any advise or assistance for pt.

## 2020-02-22 NOTE — Addendum Note (Signed)
Addended by: Wyvonne Lenz on: 02/22/2020 08:50 AM   Modules accepted: Orders

## 2020-02-22 NOTE — Telephone Encounter (Signed)
[place] not require out of pocket spending at this time. But that requirement is only for Medicare D patients.  Patient is uninsured.

## 2020-02-28 ENCOUNTER — Other Ambulatory Visit (HOSPITAL_COMMUNITY): Payer: Self-pay | Admitting: Pulmonary Disease

## 2020-02-28 MED ORDER — AMOXICILLIN-POT CLAVULANATE 875-125 MG PO TABS: 1.0000 | ORAL_TABLET | Freq: Two times a day (BID) | ORAL | 0 refills | Status: AC

## 2020-02-28 MED ORDER — AMOXICILLIN-POT CLAVULANATE 875-125 MG PO TABS: 1.0000 | ORAL_TABLET | Freq: Every day | ORAL | 0 refills | Status: AC

## 2020-02-28 NOTE — Addendum Note (Signed)
Addended by: Vianne Bulls R on: 02/28/2020 04:48 PM   Modules accepted: Orders

## 2020-02-28 NOTE — Telephone Encounter (Signed)
Prescription for Augmentin-1 tablet p.o. twice daily for 7 days for possible infection especially with increasing mucus and yellow secretions  I do not believe prednisone is needed

## 2020-02-28 NOTE — Telephone Encounter (Signed)
Appointment cancelled per patient request.  Patient advised to reschedule when he is ready.  Dr. Wynona Neat aware.  Nothing further needed.

## 2020-02-28 NOTE — Telephone Encounter (Signed)
Patient contacted by phone, medication sent to preferred pharmacy.

## 2020-02-28 NOTE — Telephone Encounter (Signed)
Patient reports one week of congestion, increased mucus, yellow secretions, runny nose, no fever, no cough. Patient requesting prednisone, please advise.

## 2020-03-02 ENCOUNTER — Ambulatory Visit: Payer: Self-pay | Admitting: Pulmonary Disease

## 2020-03-13 MED ORDER — ALBUTEROL SULFATE HFA 108 (90 BASE) MCG/ACT IN AERS: 2.0000 | INHALATION_SPRAY | Freq: Four times a day (QID) | RESPIRATORY_TRACT | 6 refills | Status: AC | PRN

## 2020-05-15 ENCOUNTER — Other Ambulatory Visit: Payer: Self-pay | Admitting: Pulmonary Disease

## 2020-05-15 MED ORDER — ALBUTEROL SULFATE (2.5 MG/3ML) 0.083% IN NEBU: 2.5000 mg | INHALATION_SOLUTION | Freq: Four times a day (QID) | RESPIRATORY_TRACT | 6 refills | Status: AC | PRN

## 2020-05-15 NOTE — Telephone Encounter (Signed)
Send in prescription for albuterol nebulization

## 2020-05-15 NOTE — Telephone Encounter (Signed)
Called and spoke with Kathlene November at [facility], he stated there is no need to transfer the prescription, all the [facility] are [facility], all the patient has to do is contact the pharmacy and can get the medication filled.  Contacted patient and made him aware.  Nothing further needed.

## 2020-05-15 NOTE — Telephone Encounter (Signed)
Dr. Olalere - please advise. Thanks. 

## 2020-05-18 NOTE — Telephone Encounter (Signed)
Dr. Val Eagle- please review pt email and advise. I think that he could get the Airduo at a reasonable price with GoodRx coupon if you want to have [place] send this in. Thanks!  Ok, however., I do not utilize insurance if that is what you referred so please issue the version of medication that apply in the category of preventional., at fair market value.

## 2020-05-18 NOTE — Telephone Encounter (Signed)
Not sure what is covered under your plan that will be more affordable.  You will need to call your plan to find out what is more affordable for you with respect to your co-pays.  Wixela, Airduo are generic versions but does not necessarily translate into cheaper/more affordable for some coverages

## 2020-05-18 NOTE — Telephone Encounter (Signed)
Dr. Olalere - please advise. Thanks. 

## 2020-05-19 NOTE — Telephone Encounter (Signed)
Sorry to hear you are sick . Last seen by Dr. Wynona Neat 6 months ago .   I can only speak to appropriate medication doses  Both are typical adult doses for Doxycycline and Prednisone .  If he is not feeling better would recommend a follow up in the office  Also recommend he reach out to the prescribing provider for additional questions   Please contact office for sooner follow up if symptoms do not improve or worsen or seek emergency care

## 2020-05-19 NOTE — Telephone Encounter (Signed)
Patient sent email today   Dr. Wynona Neat won't be back until 10/18 and I let patient know this, but if you have any recommendations to share please advise  TP please advise.  the MD prescribed 100mg  of doxcycline hyclate twice a day (200mg )  for 5 days that is equal to the sum 1000mg . my question is is that a safe dosage for my body weighing 170lbs. ? also prescribed was predisone 50mg . each tablet for 5 days one a day equaling 250mg ...  is that safe dosage for my body weighing 170lbs.?  (MD) doctor's NPI# [id number] NP-C name pratitioner's NPI# [facility] (emergency department).  I appreciate all you may due in my accord...  have a magnanimous day.

## 2020-06-13 NOTE — Telephone Encounter (Signed)
Patient is requesting Rx for Budesonide 0.5. this was last prescribed by [facility].  Dr. Wynona Neat, please advise. Thanks

## 2020-06-16 MED ORDER — BUDESONIDE 0.5 MG/2ML IN SUSP: 0.5000 mg | Freq: Two times a day (BID) | RESPIRATORY_TRACT | 1 refills | Status: DC

## 2020-06-16 NOTE — Telephone Encounter (Signed)
Yes we can prescribe. 0.5 nebulized q12 for 1 month, 1 refill

## 2020-06-16 NOTE — Telephone Encounter (Signed)
Dr. Wynona Neat please advise on rx request.  Thanks!

## 2020-06-19 ENCOUNTER — Telehealth: Payer: Self-pay | Admitting: Pulmonary Disease

## 2020-06-20 NOTE — Telephone Encounter (Signed)
Refill for the pulmicort was sent in to the said pharmacy on 06/16/20.  I called and lmom to make the pt aware of this.

## 2020-06-21 MED ORDER — BUDESONIDE 0.5 MG/2ML IN SUSP: 0.5000 mg | Freq: Two times a day (BID) | RESPIRATORY_TRACT | 1 refills | Status: AC

## 2022-01-05 IMAGING — CT CT CHEST W/ CM
2 of 4 series · 15 of 36 positions shown, 18 images · IV contrast (OMNIPAQUE 300)
Comparison: November 10, 2019

CLINICAL DATA: Persistent pneumonia

EXAM:
CT CHEST WITH CONTRAST
TECHNIQUE: Multidetector CT imaging of the chest was performed during
intravenous contrast administration.
CONTRAST:  80mL OMNIPAQUE IOHEXOL 300 MG/ML  SOLN

[Series 2: thorax · axial · 0.68mm/px · z∈[+1150,+1416]mm · 12 of 159 slices shown, 15 images]
[im 13/159  mediastinal]
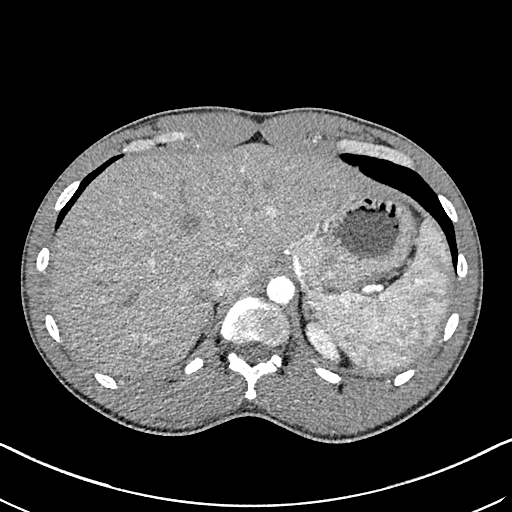
[im 13/159  lung]
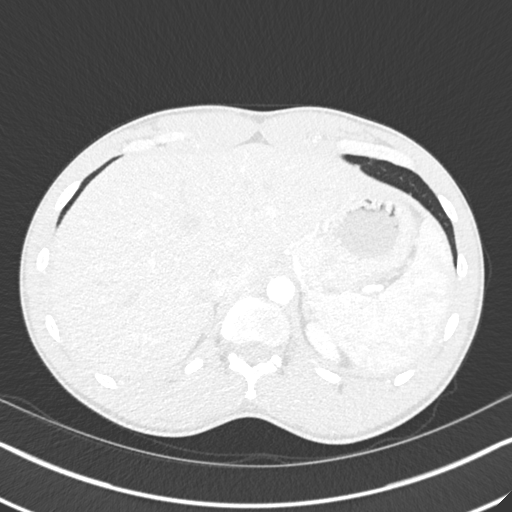
[im 25/159  lung]
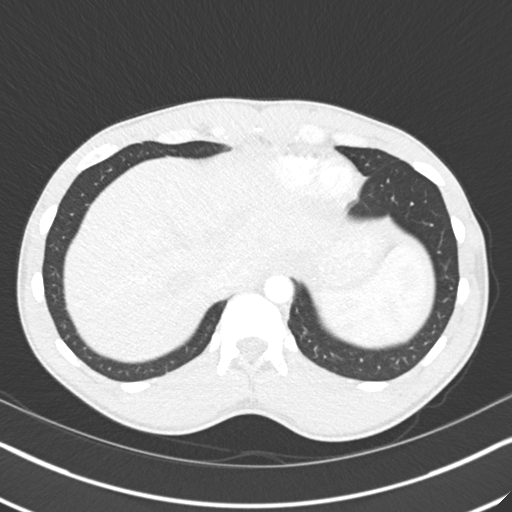
[im 37/159  lung]
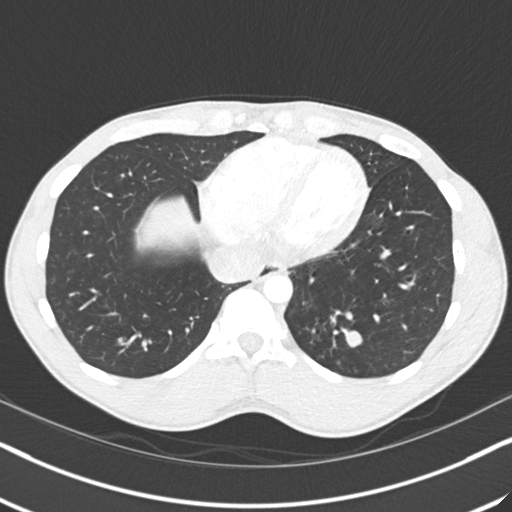
[im 49/159  lung]
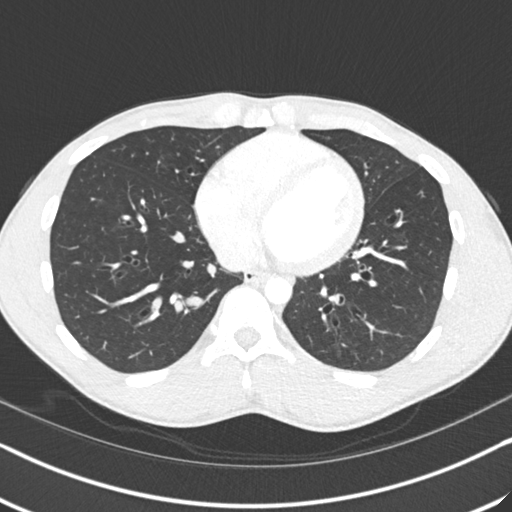
[im 61/159  mediastinal]
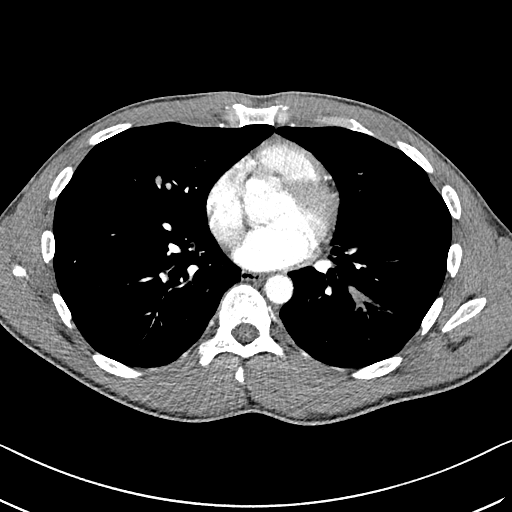
[im 61/159  lung]
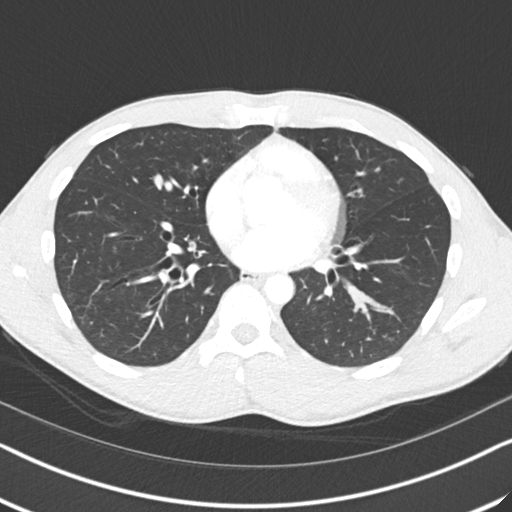
[im 73/159  lung]
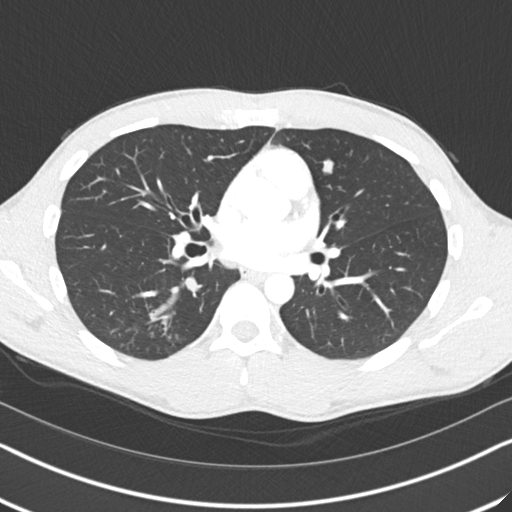
[im 86/159  lung]
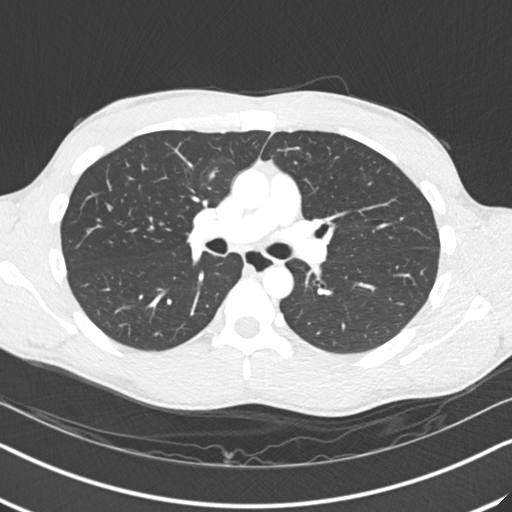
[im 98/159  lung]
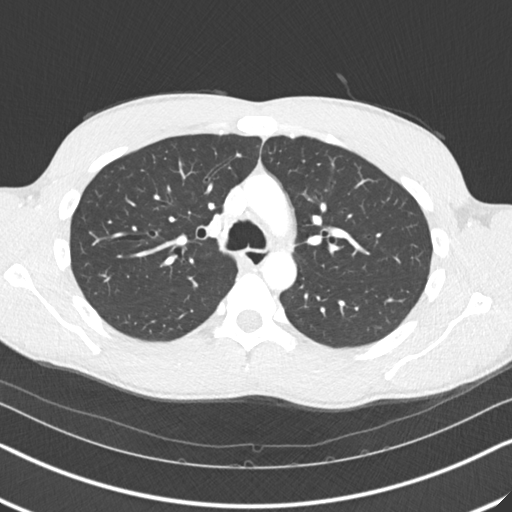
[im 110/159  mediastinal]
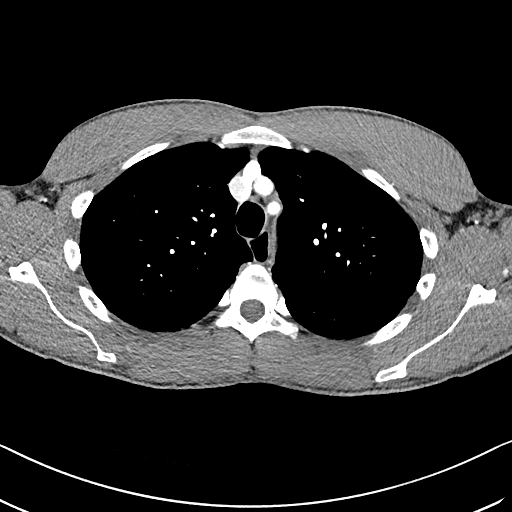
[im 110/159  lung]
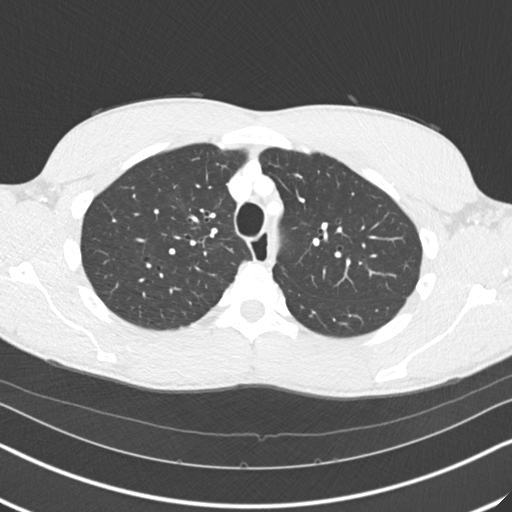
[im 122/159  lung]
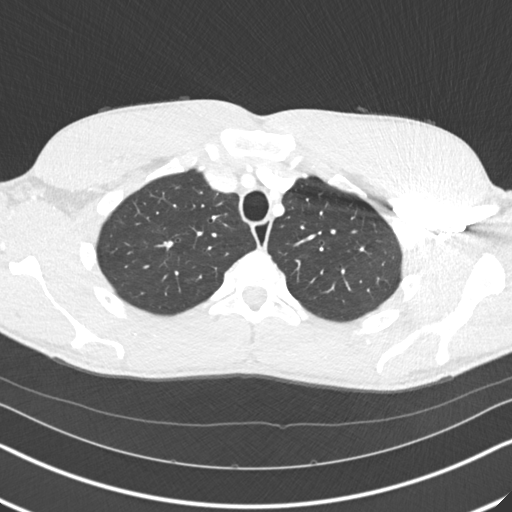
[im 134/159  lung]
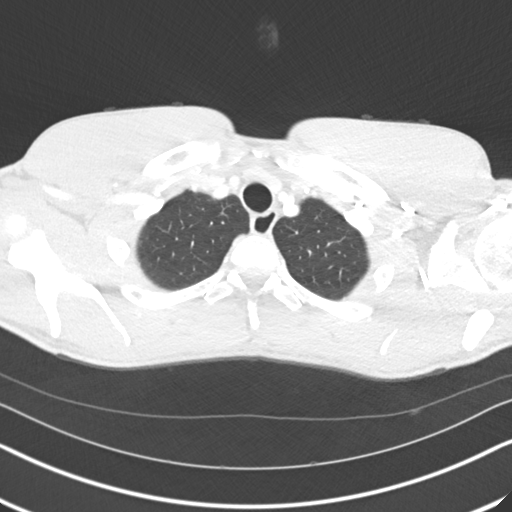
[im 146/159  lung]
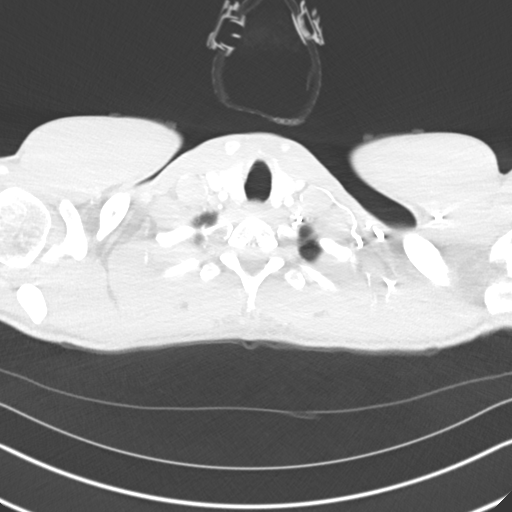

[Series 5: coronal · coronal · 0.62mm/px · 3 of 105 slices shown]
[im 21/105  lung]
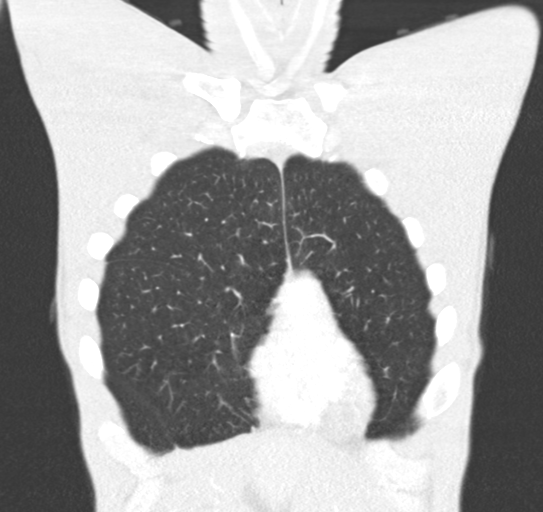
[im 42/105  lung]
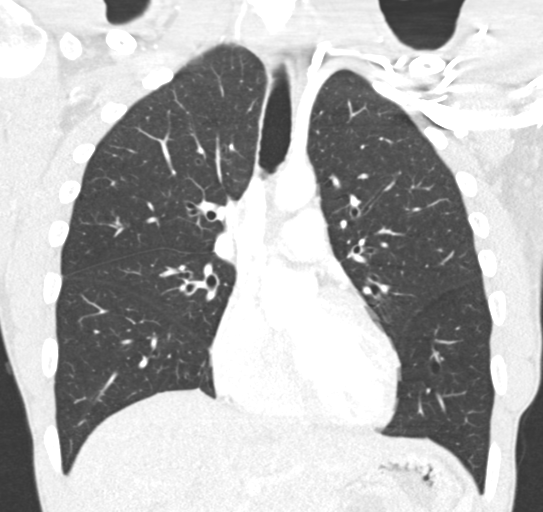
[im 63/105  lung]
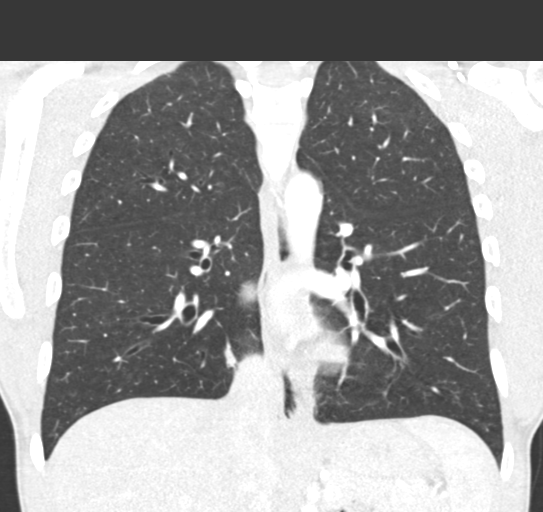

[15 of 36 positions shown; findings below may reference images not displayed]

FINDINGS: Cardiovascular: There is no demonstrable pulmonary embolus. There is
no thoracic aortic aneurysm or dissection. Visualized great vessels
appear unremarkable. No pericardial effusion or pericardial
thickening evident.

Mediastinum/Nodes: Thyroid appears unremarkable. There is no
appreciable thoracic adenopathy. No esophageal lesions are evident.

Lungs/Pleura: There is again noted lower lobe bronchiectatic change
bilaterally with multiple areas of apparent mucous plugging in lower
lobe. A lesser degree of upper lobe bronchiectatic changes noted,
stable. There is again noted anterior segment left upper lobe mucous
plugging. no central airways consolidation evident. In comparison
with the previous study, there remains a mild degree of tree on Momin
type appearance in the superior segment right lower lobe consistent
with residual pneumonia. There is slightly less infiltrate in the
right lower lobe compared to previous study. No new areas of
infiltrate evident. No pleural effusions are appreciable.

Upper Abdomen: Visualized upper abdominal structures appear
unremarkable.

Musculoskeletal: There are no blastic or lytic bone lesions. No
evident chest wall lesions.
IMPRESSION: 1. Persistent bronchiectatic change, most severe in the lower lobe
regions with multiple areas of peripheral mucous plugging, stable.
No new airways obstruction. This finding may warrant bronchoscopy
for further assessment.

2. There is persistent tree on Momin type appearance in portions of
the superior segment right upper lobe, consistent with pneumonia.
There is slightly less of this appearance in the right lower lobe
compared to prior study suggesting mild partial clearing in this
area. No new evidence of infiltrate. No pleural effusion. No edema.

3.  No evident adenopathy.
